# Patient Record
Sex: Female | Born: 1995 | Race: White | Hispanic: No | Marital: Married | Smoking: Never smoker
Health system: Western US, Academic
[De-identification: ages and names within clinical notes are randomized; demographics above are authoritative.]

---

## 2019-05-21 ENCOUNTER — Other Ambulatory Visit: Payer: Self-pay

## 2019-05-22 ENCOUNTER — Encounter (INDEPENDENT_AMBULATORY_CARE_PROVIDER_SITE_OTHER): Payer: Self-pay | Admitting: Family Medicine

## 2019-05-22 ENCOUNTER — Telehealth (INDEPENDENT_AMBULATORY_CARE_PROVIDER_SITE_OTHER): Payer: TRICARE Prime—HMO | Admitting: Family Medicine

## 2019-05-22 DIAGNOSIS — Z1389 Encounter for screening for other disorder: Secondary | ICD-10-CM

## 2019-05-22 DIAGNOSIS — Z1339 Encounter for screening examination for other mental health and behavioral disorders: Secondary | ICD-10-CM

## 2019-05-22 DIAGNOSIS — E282 Polycystic ovarian syndrome: Secondary | ICD-10-CM

## 2019-05-22 DIAGNOSIS — Z3009 Encounter for other general counseling and advice on contraception: Secondary | ICD-10-CM

## 2019-05-22 MED ORDER — METFORMIN HCL PO
ORAL | Status: DC
Start: ? — End: 2020-11-14

## 2019-05-22 MED ORDER — DESOGESTREL-ETHINYL ESTRADIOL 0.15-30 MG-MCG OR TABS
1.0000 | ORAL_TABLET | Freq: Every day | ORAL | 3 refills | Status: DC
Start: 2019-05-22 — End: 2020-03-01

## 2019-05-22 NOTE — Progress Notes (Signed)
FAMILY MEDICINE TELEMEDICINE PROGRESS NOTE    CC:    Chief Complaint   Patient presents with   . Contraception       SUBJECTIVE:    Shannon Bryant is a 23 year old female who is being seen for the following issues:    ---------------------(data below generated by Landis Martins, MD)--------------------    Patient Verification & Telemedicine Consent:    I am proceeding with this evaluation at the direct request of the patient.  I have verified this is the correct patient and have obtained verbal consent and written consent from the patient/ surrogate to perform this voluntary telemedicine evaluation (including obtaining history, performing examination and reviewing data provided by the patient).   The patient/ surrogate has the right to refuse this evaluation.  I have explained risks (including potential loss of confidentiality), benefits, alternatives, and the potential need for subsequent face to face care. Patient/ surrogate understands that there is a risk of medical inaccuracies given that our recommendations will be made based on reported data (and we must therefore assume this information is accurate).  Knowing that there is a risk that this information is not reported accurately, and that the telemedicine video, audio, or data feed may be incomplete, the patient agrees to proceed with evaluation and holds [place] harmless knowing these risks. In this evaluation, we will be providing recommendations only.  The ultimate decision to follow, or not follow, these recommendations will be left to the bedside treating/ requesting practitioner.  The patient/ surrogate has been notified that other healthcare professionals (including students, residents and Metallurgist) may be involved in this audio-video evaluation.   All laws concerning confidentiality and patient access to medical records and copies of medical records apply to telemedicine.  The patient/ surrogate has received the [place] Notice of Privacy  Practices.  I have reviewed this above verification and consent paragraph with the patient/ surrogate.  If the patient is not capacitated to understand the above, and no surrogate is available, since this is not an emergency evaluation, the visit will be rescheduled until such time that the patient can consent, or the surrogate is available to consent.    Demographics:   Medical Record #: [id number]   Date: May 22, 2019   Patient Name: Shannon Bryant   DOB: [birth year]  Age: 23 year old  Sex: female  Location: Home address on file      Evaluator(s):   Shannon Bryant was evaluated by me today.    Clinic Location:  [facility]  [address]  [place] [address]    Interval History: just moved from [place] bc husband is in [employer]    #OCP  Rx'd from [place]  Running out  [place] on for about 4 yrs, isibloom for most of that time, approx 3 yrs  No side effects  Has nl period, 3 days of light bleeding  Never smoker  No migraines  No personal or Fhx clots  Last PAP 01/2019, nl, never had abnl  G0  No h/o STI, not interested in screening    #PCOS  Was told she might have PCOS by gynecologist  Metformin 1 yr ago  Sister has severe PCOS  Metformin causes GI upset  Wt gain, always struggled w/ weight, had lost 10# before moving but has gained   Does have acne  No hair growth  Pt not convinced she has PCOS and  struggles to adhere to metformin    #h/o anxiety  Just stopped fluoextine, did it gradually   Feels well       See ROS above in HPI    Reviewed PMH, meds/allergies/SH    There is no problem list on file for this patient.    No past medical history on file.  No outpatient medications prior to visit.     No facility-administered medications prior to visit.        OBJECTIVE:  There were no vitals taken for this visit.    Physical Exam   Constitutional: She is well-developed, well-nourished, and in no distress.   HENT:   Head: Normocephalic and atraumatic.    Eyes: Conjunctivae are normal. No scleral icterus.   Skin:   Some facial acne.  No clear hair growth on face       LABS:  No results found for this or any previous visit.    STUDIES:     ASSESSMENT & PLAN:  Shannon Bryant is a 23 year old female was seen today for:  Fabia was seen today for contraception.    Diagnoses and all orders for this visit:    Family planning: Pt on OCP's for 4 yrs w/out side effects, pt requesting a refill.  No concerns or contraindications.  Refill until pt can establish with PCP.  Pt up to date on PAP and is low risk. Pt declined STI screening.  -     desogestrel-ethinyl estradiol (ISIBLOOM) 0.15-30 MG-MCG tablet; Take 1 tablet by mouth daily.    PCOS: Pt states she was dx'd w/ PCOS by gyn but she's not convinced that she has it.  She'd like to talk about this w/ new PCP in Oct 2020.  Pt on metformin and OCP's but struggles to adhere to metformin.  Plan to evaluate further at appt 07/2019     Screened negative for alcohol use    Screened negative for drug use            Health Maintenance reviewed -  Health Maintenance   Topic Date Due   . Pap Smear  24-Jul-1996   . CHLAMYDIA SCREENING  08-19-96   . HPV Vaccine <= 26 Yrs (1 - 2-dose series) 07/22/2007   . PHQ2 depression screen  07/21/2014   . Tetanus (1 - Tdap) 07/22/2015   . Influenza (1) 07/02/2019   . Polio Vaccine  Aged Out   . Meningococcal MCV4 Vaccine  Aged Out   . Pneumococcal 0-64 yrs  Aged Out       Patient Instructions   It was great meeting you today and welcome to [facility]!  I prescribed isibloom to [facility] with a 3 month supply.  Let us know if you need anything before your appointment with Dr. Danise Mina on 07/06/19.      Patient verbalized understanding of teaching and instructions, in agreement with plan of care.    Return in about 6 weeks (around 07/06/2019) for pt already has establish care appt w/ Dr. Danise Mina.      Landis Martins, MD      Plan discussed with pt including risks/benefits/alternatives  including watchful waiting.  Informed pt of 24/7 on call MD.  ED if acutely worsening after hours.  Pt verbalized understanding.    Medications reviewed with patient and medication list reconciled.  Over the counter medications, herbal therapies and supplements reviewed.  Patient's understanding and response to medications assessed.    Barriers to medications  assessed and addressed.  Risks, benefits, alternatives to medications reviewed.

## 2019-05-22 NOTE — Patient Instructions (Addendum)
It was great meeting you today and welcome to [facility]!  I prescribed [facility]ib[facility]om to [facility] with a 3 month supply.  Let us know if you need anything before your appointment with Dr. Danise Mina on 07/06/19.

## 2019-05-22 NOTE — Interdisciplinary (Signed)
Pre-visit chart review and huddle completed with staff and physician.    Outstanding labs, imaging and consults reviewed and identified.    Health maintanence issues identified and addressed:    Health Maintenance   Topic Date Due    Pap Smear  Dec 27, 1995    CHLAMYDIA SCREENING  1995/10/24    HPV Vaccine <= 26 Yrs (1 - 2-dose series) 07/22/2007    PHQ2 depression screen  07/21/2014    Tetanus (1 - Tdap) 07/22/2015    Influenza (1) 07/02/2019    Polio Vaccine  Aged Out    Meningococcal MCV4 Vaccine  Aged Out    Pneumococcal 0-64 yrs  Aged Out

## 2019-07-06 ENCOUNTER — Encounter (INDEPENDENT_AMBULATORY_CARE_PROVIDER_SITE_OTHER): Payer: Self-pay | Admitting: Family Medicine

## 2019-07-06 ENCOUNTER — Other Ambulatory Visit: Payer: TRICARE Prime—HMO | Attending: Family Medicine

## 2019-07-06 ENCOUNTER — Ambulatory Visit (INDEPENDENT_AMBULATORY_CARE_PROVIDER_SITE_OTHER): Payer: TRICARE Prime—HMO | Admitting: Family Medicine

## 2019-07-06 VITALS — BP 117/80 | HR 114 | Temp 98.6°F | Ht 62.0 in | Wt 215.0 lb

## 2019-07-06 DIAGNOSIS — E282 Polycystic ovarian syndrome: Secondary | ICD-10-CM

## 2019-07-06 DIAGNOSIS — Z0000 Encounter for general adult medical examination without abnormal findings: Secondary | ICD-10-CM | POA: Insufficient documentation

## 2019-07-06 DIAGNOSIS — Z0189 Encounter for other specified special examinations: Secondary | ICD-10-CM

## 2019-07-06 DIAGNOSIS — E669 Obesity, unspecified: Secondary | ICD-10-CM

## 2019-07-06 LAB — CBC WITH DIFF, BLOOD
ANC-Automated: 6.6 10*3/uL (ref 1.6–7.0)
Abs Basophils: 0.1 10*3/uL
Abs Eosinophils: 0.2 10*3/uL (ref 0.0–0.5)
Abs Lymphs: 4.2 10*3/uL — ABNORMAL HIGH (ref 0.8–3.1)
Abs Monos: 0.7 10*3/uL (ref 0.2–0.8)
Basophils: 1 %
Eosinophils: 2 %
Hct: 41.4 % (ref 34.0–45.0)
Hgb: 12.9 gm/dL (ref 11.2–15.7)
Imm Gran %: 1 % (ref <1)
Imm Gran Abs: 0.1 10*3/uL (ref <0.1)
Lymphocytes: 35 %
MCH: 26.9 pg (ref 26.0–32.0)
MCHC: 31.2 g/dL — ABNORMAL LOW (ref 32.0–36.0)
MCV: 86.4 um3 (ref 79.0–95.0)
MPV: 10 fL (ref 9.4–12.4)
Monocytes: 6 %
Plt Count: 443 10*3/uL — ABNORMAL HIGH (ref 140–370)
RBC: 4.79 10*6/uL (ref 3.90–5.20)
RDW: 13.4 % (ref 12.0–14.0)
Segs: 56 %
WBC: 11.8 10*3/uL — ABNORMAL HIGH (ref 4.0–10.0)

## 2019-07-06 LAB — COMPREHENSIVE METABOLIC PANEL, BLOOD
ALT (SGPT): 11 U/L (ref 0–33)
AST (SGOT): 14 U/L (ref 0–32)
Albumin: 4.3 g/dL (ref 3.5–5.2)
Alkaline Phos: 61 U/L (ref 35–140)
Anion Gap: 12 mmol/L (ref 7–15)
BUN: 9 mg/dL (ref 6–20)
Bicarbonate: 25 mmol/L (ref 22–29)
Bilirubin, Tot: 0.3 mg/dL (ref <1.2)
Calcium: 9.5 mg/dL (ref 8.5–10.6)
Chloride: 105 mmol/L (ref 98–107)
Creatinine: 0.99 mg/dL — ABNORMAL HIGH (ref 0.51–0.95)
GFR: >60 mL/min
Glucose: 103 mg/dL — ABNORMAL HIGH (ref 70–99)
Potassium: 4.2 mmol/L (ref 3.5–5.1)
Sodium: 142 mmol/L (ref 136–145)
Total Protein: 7.2 g/dL (ref 6.0–8.0)

## 2019-07-06 LAB — LIPID(CHOL FRACT) PANEL, BLOOD
Cholesterol: 195 mg/dL (ref <200)
HDL-Cholesterol: 40 mg/dL
LDL-Chol (Calc): 118 mg/dL (ref <160)
Non-HDL Cholesterol: 155 mg/dL
Triglycerides: 183 mg/dL — ABNORMAL HIGH (ref 10–170)

## 2019-07-06 LAB — GLYCOSYLATED HGB(A1C), BLOOD: Glyco Hgb (A1C): 5.5 % (ref 4.8–5.8)

## 2019-07-06 NOTE — Interdisciplinary (Signed)
Blood drawn from left arm with 21 gauge needle. 3 tubes taken.   Patient identity authenticated by Michelle Lehoski.

## 2019-07-06 NOTE — Progress Notes (Signed)
SUBJECTIVE:    Shannon Bryant is a 23 year old female here for follow up after establishing care about    Just came to [name] due [place] assignment for her husband.  She has no family in [place]    Main issues addressed today    1.  H/p PCOS.  Diagnosis made by Gyn in [place] as patient has Obesity, had irreg menses and teenager, has mild acne.  She was started of Metformin but self d/c due to GI upset: diarrhea, loose stools and abd pain    She wonders if she should have [place]  Sister with PCOS and classic finding on [place]    Has ahd borderline Glycohb usually high 5.something, always < 6.0    2.  Irreg menses all though teen years but started [facility] at age 68 and since then more regular  Does not plan pregnancy for few more years  On OPC x 3 years doing well on Isibloom    G-0  Last pap 01/2019, normal    3.  Obesity,  Since teen years.  Fam h/o obesity father side.  Acknowledges likely poor eating habits and no regular exercise.  All worsen by moving and stress  No regular exercise    4.  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      Current Outpatient Medications on File Prior to Visit   Medication Sig Dispense Refill   . desogestrel-ethinyl estradiol (ISIBLOOM) 0.15-30 MG-MCG tablet Take 1 tablet by mouth daily. 28 tablet 3   . METFORMIN HCL PO        No current facility-administered medications on file prior to visit.      4.  H/o anxiety was on Prozac but self d/c and feeling OK    OBJECTIVE:    Blood pressure 117/80, pulse 114, temperature 98.6 F (37 C), height 5\' 2"  (1.575 m), weight 97.5 kg (215 lb).    Body mass index is 39.32 kg/m.    Obese F pleasant and cooperative  Mild facial acne.  mostly lower cheeks  Neck: normal thyroid, no masses, no adenopathy  COR: RRR no urmurs       Lungs CTA  Abd: obese, soft, no TTP, no masses  Extrem; trace  edema, normal DP and PT bilaterally      ASSESSMENT/PLAN:    1.  Possible POCS: obesity, irreg menses in past, mild acne.  Will check [place].  Discussed that even if [place] is normal, she could still have PCOS    2.  Obesity   Discussed importance to weight loss.  Discussed risk for DM, and PCOS.  ALso musculoskeletal problems    Strongly recommend calorie count, change in diet, reduce over all calories    Labs requested today    3.  HM patient refused Flu vaccine today    Asked her to release records from [facility]    RTC in 4 weeks

## 2019-07-06 NOTE — Interdisciplinary (Signed)
Pre-visit chart review and huddle completed with staff and physician.    Outstanding labs, imaging and consults reviewed and identified.    Health maintanence issues identified and addressed:    Health Maintenance   Topic Date Due    Pap Smear  03-29-96    Chlamydia Screen  Feb 08, 1996    HPV Vaccine <= 26 Yrs (1 - 2-dose series) 07/22/2007    PHQ2 depression screen  07/21/2014    Tetanus (1 - Tdap) 07/22/2015    Influenza (1) 05/02/2019    Polio Vaccine  Aged Out    Meningococcal MCV4 Vaccine  Aged Out    Pneumococcal 0-64 yrs  Aged Out

## 2019-08-03 ENCOUNTER — Ambulatory Visit
Admission: RE | Admit: 2019-08-03 | Discharge: 2019-08-03 | Disposition: A | Discharge status: Home / Self Care | Payer: TRICARE Prime—HMO | Attending: Diagnostic Radiology | Admitting: Diagnostic Radiology

## 2019-08-03 DIAGNOSIS — D259 Leiomyoma of uterus, unspecified: Secondary | ICD-10-CM | POA: Insufficient documentation

## 2019-08-03 DIAGNOSIS — E282 Polycystic ovarian syndrome: Secondary | ICD-10-CM | POA: Insufficient documentation

## 2019-08-03 DIAGNOSIS — N838 Other noninflammatory disorders of ovary, fallopian tube and broad ligament: Secondary | ICD-10-CM | POA: Insufficient documentation

## 2019-08-04 ENCOUNTER — Encounter (INDEPENDENT_AMBULATORY_CARE_PROVIDER_SITE_OTHER): Payer: Self-pay | Admitting: Hospital

## 2019-08-09 ENCOUNTER — Encounter (INDEPENDENT_AMBULATORY_CARE_PROVIDER_SITE_OTHER): Payer: Self-pay | Admitting: Family Medicine

## 2019-08-10 ENCOUNTER — Encounter (INDEPENDENT_AMBULATORY_CARE_PROVIDER_SITE_OTHER): Payer: Self-pay | Admitting: Family Medicine

## 2019-08-10 NOTE — Telephone Encounter (Signed)
From: Tobe Sos  To: Karsten Fells, MD  Sent: 08/10/2019 10:13 AM PST  Subject: 1-Non Urgent Medical Advice    Dr. Danise Mina,     Thank you for the prompt message about the results from my ultrasound.     Please let me know what my next step should be. If you'd like me to schedule and appointment to come in or a video appointment, I'd be glad to.     Thank you,  Velna Hatchet

## 2019-09-09 ENCOUNTER — Ambulatory Visit (INDEPENDENT_AMBULATORY_CARE_PROVIDER_SITE_OTHER): Payer: Self-pay | Admitting: Family Medicine

## 2019-09-09 ENCOUNTER — Encounter (INDEPENDENT_AMBULATORY_CARE_PROVIDER_SITE_OTHER): Payer: Self-pay | Admitting: Family Medicine

## 2019-09-09 DIAGNOSIS — Z20822 Contact with and (suspected) exposure to covid-19: Secondary | ICD-10-CM

## 2019-09-09 LAB — EMMI, COVID-19: EMMI Video Order Number: 12215436606

## 2019-09-09 NOTE — Telephone Encounter (Signed)
PROVIDER ACTION REQUESTED: NO, FYI Only  Action item needed:  None  Appt scheduled: No    Chief Complaint  Fever, nausea, vomiting, abdominal pain x 3 days  Assessment details:  Abdominal pain is on the RLQ, was mild now moderate and constant.  Abdominal pain is not aggravated with food or activity.  Was nauseous and vomited x 1 on Monday. Vomitus was green.  Had a slight temperature. Not anymore.  Has some lightheadedness and some constipation.  Last bowel movement was yesterday. No blood noted.  Denies trauma, dysuria, blood in her urine, chest pain, sob.  Latest trans-vag ultrasound, PCOS.  Pt was advised to proceed to ER.  She will call her husband to bring her to the ER.    CLINIC/RN/LVN ACTION REQUEST: NO, FYI Only  Action item needed: NO, FYI Only    Routing to Provider for review and Patient advised to be evaluated in ED, and have another person drive them to hospital.  RN placed ED referral in Epic.     2020 FLU VACCINE: NO   COVID TRIAGE PROTOCOL: POSITIVE, see protocol for symptoms       Reason for Call: Abdominal Pain (RLQ)    Disposition: See Provider within 4 hours     COVID-19 Assessment and Triage  Updated 07/10/2019    Please note:  High risk category no longer required for testing.  Please continue to document which category they are in for risk stratification of results.    Use as part of cough, fever, URI or shortness of breath triage:  Do you have a fever OR a new cough or shortness of breath (SOB) no  or  Other symptoms seen in COVID 19 patients include NEW headache, chills with or without repeated shaking, chest tightness, anosmia (loss of smell), ageuisa (loss of taste), sore throat, diarrhea, severe, muscle pain, unexplained fatigue or syncope (fainting), nausea, vomiting, congestion or runny nose. yes      Does patient belong to one of the following categories (not required for testing)?  [] High risk includes age>65, active smoker, chronic lung disease, chronic heart/live/kidney disease,  diabetes, immunosuppression, active cancer   [] Any resident of or provider working in a senior living facility, including skilled nursing facilities or assisted living facilities.  [] Contact to known COVID-19 cases.  [] Health care worker or first responder or frontline worker like delivery or grocery  [] Persons who care for the elderly.  [] Persons experiencing homelessness.  [] Travel in the last 14 days?    [] Close contact with anyone in above above categories?   Pregnant?    No    If NO to symptoms then triage as usual.    IF YES to symptoms evaluate for disposition as per below:    Emergency Department  . Patient has difficulty breathing, sounds very sick or weak to triager, has underlying respiratory or cardiac disease (e.g., asthma, COPD, heart failure)  . If YES, ask patient to be seen in nearest [facility] emergency department  . Ask the patient to request a mask as soon as they enter the building and remind them to wash their hands or use alcohol gel.  . Please alert the location by calling that the patient will be arriving and give them a time estimate for their arrival. Please place ED referral also as per usual.    Urgent Care:   . Patients who have high risk for co-morbidity will need evaluation by a provider.  High risk factors include: age over 71, active smoker,  chronic lung / heart / liver / renal disease, immunosuppressed status, or active cancer.  If YES, refer patient to Urgent Care.  . Patient with reported high fever (100.4 F if measured).  If YES, refer patient Urgent Care.  . Ask the patient to call urgent care staff on arrival, and request a mask as soon as they enter the building and remind them to wash their hands or use alcohol gel.  . Please place [facility] referral also as per usual.  Only need to alert the location by calling that the patient will be arriving and give them a time estimate for their arrival if [facility] is full.       Schedule drive up screening (For Primary care triage nurse  only, for specialty areas, refer patient to primary care provider)  . Indicated for patients with low risk of clinical deterioration and do not have any high risk factors for severe illness.  . Schedule patient to:  [facility], appt = 2281, provider =  [place] [id number]  [facility], appt = 2281, provider = [place] [id number]  [facility], appt = 2281, provider = [place] [id number]  [place], appt = 2281, provider = [place] [id number]  [place], appt = 2281, provider = [place] [id number]  . Pend COVID order panel and route to provider for approval        Reason for Disposition  . [1] Vomiting AND [2] contains bile (green color)    Additional Information  . Negative: Shock suspected (e.g., cold/pale/clammy skin, too weak to stand, low BP, rapid pulse)  . Negative: Difficult to awaken or acting confused (e.g., disoriented, slurred speech)  . Negative: Passed out (i.e., lost consciousness, collapsed and was not responding)  . Negative: Sounds like a life-threatening emergency to the triager  . Negative: Chest pain  . Negative: Pain is mainly in upper abdomen  (if needed ask: "is it mainly above the belly button?")  . Negative: Followed an abdomen (stomach) injury  . Negative: [1] Abdominal pain AND [2] pregnant < 20 weeks  . Negative: [1] Abdominal pain AND [2] pregnant > 20 weeks  . Negative: [1] Abdominal pain AND [2] postpartum (from 0 to 6 weeks after delivery)  . Negative: [1] SEVERE pain (e.g., excruciating) AND [2] present > 1 hour  . Negative: [1] SEVERE pain AND [2] age > 106  . Negative: [1] Vomiting AND [2] contains red blood or black ("coffee ground") material  (Exception: few red streaks in vomit that only happened once)  . Negative: Blood in bowel movements   (Exception: blood on surface of BM with constipation)  . Negative: Black or tarry bowel movements  (Exception: chronic-unchanged  black-grey bowel movements AND  is taking iron pills or Pepto-bismol)  . Negative: Patient sounds very sick or weak to the triager  . Negative: [1] MILD-MODERATE pain AND [2] constant AND [3] present > 2 hours  . Negative: [1] Vomiting AND [2] abdomen looks much more swollen than usual    Answer Assessment - Initial Assessment Questions  1. LOCATION: "Where does it hurt?"       RLQ    2. RADIATION: "Does the pain shoot anywhere else?" (e.g., chest, back)      No    3. ONSET: "When did the pain begin?" (e.g., minutes, hours or days ago)       3 days    4. SUDDEN: "Gradual or sudden  onset?"      Gradual     5. PATTERN "Does the pain come and go, or is it constant?"     - If constant: "Is it getting better, staying the same, or worsening?"       (Note: Constant means the pain never goes away completely; most serious pain is constant and it progresses)      - If intermittent: "How long does it last?" "Do you have pain now?"      (Note: Intermittent means the pain goes away completely between bouts)      Now constant    6. SEVERITY: "How bad is the pain?"  (e.g., Scale 1-10; mild, moderate, or severe)    - MILD (1-3): doesn't interfere with normal activities, abdomen soft and not tender to touch     - MODERATE (4-7): interferes with normal activities or awakens from sleep, tender to touch     - SEVERE (8-10): excruciating pain, doubled over, unable to do any normal activities       Mild to moderate    7. RECURRENT SYMPTOM: "Have you ever had this type of abdominal pain before?" If so, ask: "When was the last time?" and "What happened that time?"       No    8. CAUSE: "What do you think is causing the abdominal pain?"      Not sure    9. RELIEVING/AGGRAVATING FACTORS: "What makes it better or worse?" (e.g., movement, antacids, bowel movement)      None    10. OTHER SYMPTOMS: "Has there been any vomiting, diarrhea, constipation, or urine problems?"        Some constipation    11. PREGNANCY: "Is there any chance you are pregnant?" "When was your last  menstrual period?"        No    Protocols used: ABDOMINAL PAIN - [facility]

## 2019-09-09 NOTE — Telephone Encounter (Signed)
Symptom Call        Next office visit:  None  Did you offer Express Care/Urgent Care:  No    What symptom is the patient experiencing? Patient is calling in stating on 09/07/2019 she was experiencing nausea, hot flashes, chills, and lower right abdominal pain. Patient states that night she vomited once due to the pain. Patient states that yesterdays he felt a lot better only experiencing a dual lower pain. Patient states that today the pain in still experiencing a constant dual cramp and would like to see if she needs to be seen for possible appendicitis or if a cyst ruptured.    Is this a new or ongoing symptom? new  Estimated time since experiencing symptom(s)? 3 days  Is this symptom complaint the result of a fall or injury? No    Name of PCP Provider: Karsten Fells   Insurance Coverage Verified: Active- in network  Last office visit: 07/06/2019    Who is reporting the symptoms? Incoming call from patient    Best way to contact patient: [phone] (mobile)   Alternative communication method: [phone] (mobile)       Encounter created by Care Assist MA.  If further action required please route encounter to appropriate in clinic MA/LVN/Resident pool.

## 2019-10-10 ENCOUNTER — Encounter: Payer: Self-pay | Admitting: Hospital

## 2019-10-16 ENCOUNTER — Encounter: Payer: Self-pay | Admitting: Hospital

## 2019-11-26 ENCOUNTER — Other Ambulatory Visit: Payer: TRICARE Prime—HMO | Attending: Family Medicine

## 2019-11-26 ENCOUNTER — Encounter (INDEPENDENT_AMBULATORY_CARE_PROVIDER_SITE_OTHER): Payer: Self-pay | Admitting: Family Medicine

## 2019-11-26 ENCOUNTER — Ambulatory Visit (INDEPENDENT_AMBULATORY_CARE_PROVIDER_SITE_OTHER): Payer: TRICARE Prime—HMO | Admitting: Family Medicine

## 2019-11-26 VITALS — BP 119/75 | HR 108 | Temp 97.9°F | Ht 62.0 in | Wt 214.0 lb

## 2019-11-26 DIAGNOSIS — F419 Anxiety disorder, unspecified: Secondary | ICD-10-CM

## 2019-11-26 DIAGNOSIS — E282 Polycystic ovarian syndrome: Secondary | ICD-10-CM | POA: Insufficient documentation

## 2019-11-26 DIAGNOSIS — Z0189 Encounter for other specified special examinations: Secondary | ICD-10-CM

## 2019-11-26 DIAGNOSIS — Z139 Encounter for screening, unspecified: Secondary | ICD-10-CM

## 2019-11-26 DIAGNOSIS — Z113 Encounter for screening for infections with a predominantly sexual mode of transmission: Secondary | ICD-10-CM | POA: Insufficient documentation

## 2019-11-26 LAB — CBC WITH DIFF, BLOOD
ANC-Manual Mode: 7.1 10*3/uL — ABNORMAL HIGH (ref 1.6–7.0)
Abs Basophils: 0 10*3/uL
Abs Eosinophils: 0.1 10*3/uL (ref 0.0–0.5)
Abs Lymphs: 5.3 10*3/uL — ABNORMAL HIGH (ref 0.8–3.1)
Abs Monos: 0.4 10*3/uL (ref 0.2–0.8)
Basophils: 0 %
Eosinophils: 1 %
Hct: 41 % (ref 34.0–45.0)
Hgb: 12.7 gm/dL (ref 11.2–15.7)
Lymphocytes: 41 %
MCH: 26.3 pg (ref 26.0–32.0)
MCHC: 31 g/dL — ABNORMAL LOW (ref 32.0–36.0)
MCV: 84.9 um3 (ref 79.0–95.0)
MPV: 10.3 fL (ref 9.4–12.4)
Monocytes: 3 %
Plt Count: 455 10*3/uL — ABNORMAL HIGH (ref 140–370)
RBC: 4.83 10*6/uL (ref 3.90–5.20)
RDW: 13.6 % (ref 12.0–14.0)
Segs: 54 %
WBC: 12.9 10*3/uL — ABNORMAL HIGH (ref 4.0–10.0)

## 2019-11-26 LAB — BASIC METABOLIC PANEL, BLOOD
Anion Gap: 13 mmol/L (ref 7–15)
BUN: 8 mg/dL (ref 6–20)
Bicarbonate: 25 mmol/L (ref 22–29)
Calcium: 10.4 mg/dL (ref 8.5–10.6)
Chloride: 105 mmol/L (ref 98–107)
Creatinine: 0.86 mg/dL (ref 0.51–0.95)
GFR: >60 mL/min
Glucose: 96 mg/dL (ref 70–99)
Potassium: 4.2 mmol/L (ref 3.5–5.1)
Sodium: 143 mmol/L (ref 136–145)

## 2019-11-26 LAB — MDIFF
Bands: 1 % (ref 0–15)
Number of Cells Counted: 115
Plt Est: INCREASED

## 2019-11-26 LAB — TSH, BLOOD: TSH: 2.46 u[IU]/mL (ref 0.27–4.20)

## 2019-11-26 MED ORDER — FLUOXETINE HCL 20 MG OR CAPS
20.0000 mg | ORAL_CAPSULE | Freq: Every day | ORAL | 1 refills | Status: DC
Start: 2019-11-26 — End: 2020-01-22

## 2019-11-26 NOTE — Progress Notes (Signed)
SUBJECTIVE:    Shannon Bryant is a 24 year old female who is here for evaluation of possible anxiety and did difficulty focusing.  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    She moved to [facility] last May to be with her husband who is in the [employer].  They are both here for few more years.  Initially she had a hard time are being away from her family in [place], but now she has adjusted much better to [facility].  She feels that she is happy in [place], she had made some good friends through her church, her husband is very supportive and she connects with her family almost weekly via social media    GAD-7 score was 5 and peak [facility] 9 was 6. She denies symptoms of depression, she feels that are she is adjusting to [facility], she likes to [place] and again has made new friends and has a very supportive husband    She mostly describes feeling a bit overwhelm trying to study, getting shorts done around the house.  She is worrying or over worrying about things, having more difficulty relaxing.  She denies panic attacks.  She thinks that this is affecting her ability to study and be more focus in she is also wonder about ADD.  She has never had any problems through elementary school middle school or high school or even college.  She is able to read a full novel, complete projects and arm is studied in the past without any problems so it is unlikely that this is related to ADD    Patient reports history of anxiety and mild depression arm probably 2 years ago for which she took Prozac for about 6 months this was beneficial to her at the time this was before her moved to [facility] and she was feeling more sad and upset about things.  She thinks that arm this is a bit different than what she is feeling today which is more anxiety and no depression    She did very well on Prozac, had no adverse side effects took for about 6 months    There is no problem list on  file for this patient.      Current Outpatient Medications on File Prior to Visit   Medication Sig Dispense Refill   . desogestrel-ethinyl estradiol (ISIBLOOM) 0.15-30 MG-MCG tablet Take 1 tablet by mouth daily. 28 tablet 3   . METFORMIN HCL PO        No current facility-administered medications on file prior to visit.        OBJECTIVE:    Blood pressure 119/75, pulse 108, temperature 97.9 F (36.6 C), height 5\' 2"  (1.575 m), weight 97.1 kg (214 lb).    She looks well no acute distress.  Smiles.  Speak fluently normal logic and thought process.  Able to express her feelings and concerns    Denies all of the following:  S high, HI, mania, hallucinations, crying spells, sadness, periods of hopelessness    Acknowledge feelings of anxiety, over worrying about things, difficulty relaxing.  Denies alcohol use, recreational drugs, smoking and beta no cannabis use      ASSESSMENT/PLAN:    1.  Symptoms are more consistent with anxiety disorder.  Discussed at length are elements of depression versus ADD.  At this times does not seem that she has depression.  And her history is not  consistent with ADD.  We discussed at length restarting the Prozac since she did very well on it.     Will do labs today including a TSH repeated quite blood count that was elevated last November and check her blood sugars due to PCOS    Plan to visit in 3 weeks via video.  If she has any concerns issues or problem with Prozac she will let me know as immediately.    2.  Maintenance issues.  Will do chlamydia screen today with 3 urine patient declined HPV and influenza vaccines    Return 3 weeks via video

## 2019-11-26 NOTE — Interdisciplinary (Signed)
Pre-visit chart review and huddle completed with staff and physician.    Outstanding labs, imaging and consults reviewed and identified.    Health maintanence issues identified and addressed:    Health Maintenance   Topic Date Due    Chlamydia Screen  Never done    HPV Vaccine <= 26 Yrs (1 - 2-dose series) Never done    Influenza (1) Never done    PHQ2 depression screen  07/05/2020    Pap Smear  02/27/2022    Tetanus (2 - Td) 06/05/2025    Polio Vaccine  Aged Out    Meningococcal MCV4 Vaccine  Aged Out    Pneumococcal 0-64 yrs  Aged Out

## 2019-11-26 NOTE — Interdisciplinary (Signed)
Blood drawn from left arm with 21 gauge needle. 2 tubes taken.   Patient identity authenticated by Melody Gale.

## 2019-11-27 LAB — CHLAMYDIA/GONORRHEA PCR, URINE
Chlamydia trachomatis PCR, Urine: NOT DETECTED
Neisseria gonorrhoeae PCR, Urine: NOT DETECTED

## 2019-12-02 ENCOUNTER — Telehealth (INDEPENDENT_AMBULATORY_CARE_PROVIDER_SITE_OTHER): Payer: Self-pay | Admitting: Family Medicine

## 2019-12-02 ENCOUNTER — Encounter (INDEPENDENT_AMBULATORY_CARE_PROVIDER_SITE_OTHER): Payer: Self-pay | Admitting: Family Medicine

## 2019-12-02 DIAGNOSIS — D72829 Elevated white blood cell count, unspecified: Secondary | ICD-10-CM

## 2019-12-02 DIAGNOSIS — R300 Dysuria: Secondary | ICD-10-CM

## 2019-12-02 NOTE — Telephone Encounter (Signed)
From: Tobe Sos  To: Karsten Fells, MD  Sent: 12/02/2019 2:57 PM PST  Subject: 20-Other    Attached you will find my last blood test results before moving to [facility]. Please let me know if you need any further information from me. Thank you!

## 2019-12-02 NOTE — Telephone Encounter (Signed)
Telephone call with patient    Discussed her Peristent elevated WBC.  She also tells me that it has been elevated last May, before she moved to [place]    Further questioning.  Had veru mild Covid about Jan 22 and tested positive for Covid on Jan 24 at [facility].  She never had fevers, just malaise and fatigue.  Can not remember cough or uri symptoms.  No GI symptoms.     She quarantine x 2 weeks and she was well  Husband was sicker      Patient denies F/C, NS. Fatigue, weight loss, rashes, joint pain and swelling.  No cough, SOB/DOE, sinus pressure, no GI symptoms and no urinary problems    Discussed getting additional labs, check urine, Chest X-ray

## 2019-12-06 ENCOUNTER — Other Ambulatory Visit (HOSPITAL_BASED_OUTPATIENT_CLINIC_OR_DEPARTMENT_OTHER): Payer: Self-pay | Admitting: Hematology & Oncology

## 2019-12-06 DIAGNOSIS — D72820 Lymphocytosis (symptomatic): Secondary | ICD-10-CM

## 2019-12-06 DIAGNOSIS — D75839 Thrombocytosis, unspecified: Secondary | ICD-10-CM

## 2019-12-06 NOTE — Progress Notes (Signed)
Consult Question:     Without signs and symptoms of infections, two elevated WBC     My clinical question: does she need blood cx, urine cs, sputum?     Consulting Provider: Waldon Reining A      HPI: Shannon Bryant is a 24 year old female with history of anxiety/difficulty focusing, PCOS, with persistent, mild, leucocytosis      There is no report  of fever or symptoms suggestive of an infectious process based on chart review.   Reportedly never smoker, drinks a glass of wine a week. Was started on fluoxetine on 11/25/18 for anxiety disorder. Otherwise, no supplements on her med list .     Current Outpatient Medications   Medication Instructions   . desogestrel-ethinyl estradiol (ISIBLOOM) 0.15-30 MG-MCG tablet 1 tablet, Oral, DAILY   . FLUoxetine (PROZAC) 20 mg, Oral, DAILY   . METFORMIN HCL PO No dose, route, or frequency recorded.         Upon review of physical exam noted in the chart-     No mention of adenopathy or hepatosplenomegaly.    Labs:  Lab Results   Component Value Date    HGB 12.7 11/26/2019    HCT 41.0 11/26/2019    WBC 12.9 (H) 11/26/2019    PLT 455 (H) 11/26/2019    MCV 84.9 11/26/2019     Lab Results   Component Value Date    WBC 12.9 (H) 11/26/2019    WBC 11.8 (H) 07/06/2019       Lab Results   Component Value Date    PLT 455 (H) 11/26/2019    PLT 443 (H) 07/06/2019       Results for Bryant, Shannon SALDIERNA (MRN [id number]) as of 12/06/2019 21:17   Ref. Range 07/06/2019 13:47 11/26/2019 00:00   ANC-Manual Mode Latest Ref Range: 1.6 - 7.0 1000/mm3  7.1 (H)   Abs Lymphs Latest Ref Range: 0.8 - 3.1 1000/mm3 4.2 (H) 5.3 (H)   Abs Monos Latest Ref Range: 0.2 - 0.8 1000/mm3 0.7 0.4   Abs Eosinophils Latest Ref Range: 0.0 - 0.5 1000/mm3 0.2 0.1   Abs Basophils Latest Ref Range: 0.0 1000/mm3 0.1 0.0   Imm Gran Abs Latest Ref Range: <0.1 1000/mm3 0.1    Imm Gran % Latest Ref Range: <1 % 1          I personally reviewed the images ( cellavision/ CALM/ 11/26/19) and my findings are as outlined below  WBC: normal in  morphology and distribution. Normal appearing neutrophils, few granular lymphs noted.  Platelets: normal in number and granulation.   RBC: microcytosis +, normochromnic,  no inclusions, no nucleated forms.         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 Also  has mild thrombocytosis.     The differential for leucocytosis is broad.   Common reasons for elevated wbc (neutrophilia) include primary disorders such as hematologic malignancies, genetic disorders like leukocyte adhesion deficiency, hereditary neutrophilia, or normal variation. Alternatively, secondary causes include medications (steroids, G-CSF, lithium, catecholoamines), stress, infection, inflammation, hematologic or solid malignancies, cigarette smoking, intense exercise or asplenia.    On review of  her labs, she has mild leucocytosis dating back to October 2020. There is mild lymphocytosis and neutrophilia, without left shift , eosinophilia or basophilia. On review of the peripheral blood smear, a few granular lymphs are noted. Overall, possibly representative of a  reactive process.     Recs  If clinically warranted and symptomatic, would consider evaluation for an infection.   Otherwise, would consider evaluating for a clonal process with peripheral blood flowcytometry  ( I  have placed orders and am happy to review the results once patient has had them.)  Prior records with CBC trend would be helpful to gauge chronicity of the process.   Since counts are only  mildly elevated, would consider monitoring  CBC for now, unless if she develops new symptoms, at which point, a more detailed hematologic evaluation could be considered.        Thank you for the eConsult,  Caralee Ates, MD, MD  Signature Derived From Controlled Access Password, December 06, 2019, 9:17 PM    The recommendations provided in this eConsult are based on the clinical data available to me at the time, and are furnished without the  benefit of a comprehensive in-person evaluation of the patient.  Any new clinical issues or changes in patient status since the filing of this eConsult will need to be taken into account when assessing these recommendations.  Please contact me if you have further questions.

## 2019-12-07 ENCOUNTER — Other Ambulatory Visit: Payer: TRICARE Prime—HMO | Attending: Hematology & Oncology

## 2019-12-07 DIAGNOSIS — D72820 Lymphocytosis (symptomatic): Secondary | ICD-10-CM

## 2019-12-07 DIAGNOSIS — R300 Dysuria: Secondary | ICD-10-CM

## 2019-12-07 DIAGNOSIS — D72829 Elevated white blood cell count, unspecified: Secondary | ICD-10-CM | POA: Insufficient documentation

## 2019-12-07 DIAGNOSIS — D473 Essential (hemorrhagic) thrombocythemia: Secondary | ICD-10-CM | POA: Insufficient documentation

## 2019-12-07 DIAGNOSIS — D75839 Thrombocytosis, unspecified: Secondary | ICD-10-CM

## 2019-12-07 LAB — CBC WITH DIFF, BLOOD
ANC-Automated: 4.9 10*3/uL (ref 1.6–7.0)
Abs Basophils: 0.1 10*3/uL
Abs Eosinophils: 0.2 10*3/uL (ref 0.0–0.5)
Abs Lymphs: 3.7 10*3/uL — ABNORMAL HIGH (ref 0.8–3.1)
Abs Monos: 0.6 10*3/uL (ref 0.2–0.8)
Basophils: 1 %
Eosinophils: 2 %
Hct: 39.5 % (ref 34.0–45.0)
Hgb: 12.8 gm/dL (ref 11.2–15.7)
Imm Gran Abs: 0.1 10*3/uL (ref <0.1)
Lymphocytes: 39 %
MCH: 27 pg (ref 26.0–32.0)
MCHC: 32.4 g/dL (ref 32.0–36.0)
MCV: 83.3 um3 (ref 79.0–95.0)
MPV: 10.5 fL (ref 9.4–12.4)
Monocytes: 6 %
Plt Count: 407 10*3/uL — ABNORMAL HIGH (ref 140–370)
RBC: 4.74 10*6/uL (ref 3.90–5.20)
RDW: 13.7 % (ref 12.0–14.0)
Segs: 52 %
WBC: 9.5 10*3/uL (ref 4.0–10.0)

## 2019-12-07 LAB — C-REACTIVE PROTEIN, BLOOD: CRP: 0.44 mg/dL (ref <0.5)

## 2019-12-07 LAB — IBC - IRON BINDING CAPACITY
Iron Saturation: 9 %
Iron: 38 ug/dL (ref 37–145)
Total IBC: 436 ug/dL (ref 148–506)
UIBC: 398 ug/dL — ABNORMAL HIGH (ref 112–346)

## 2019-12-07 LAB — SED RATE, BLOOD: Sed Rate: 12 mm/hr (ref 0–20)

## 2019-12-07 LAB — FERRITIN, BLOOD: Ferritin: 52 ng/mL (ref 13–150)

## 2019-12-07 NOTE — Interdisciplinary (Addendum)
As per pt - all lab orders for today    Blood drawn from left arm with 21 gauge butterfly needle. 6 tubes taken.   Patient identity authenticated by Kelli Churn.    Blood culture # 1 drawn from Left antecubital 2 tubes.    Sample labeled and sent to the lab.

## 2019-12-07 NOTE — Addendum Note (Signed)
Addended by: Kelli Churn on: 12/07/2019 03:53 PM     Modules accepted: Orders

## 2019-12-08 LAB — LYMPHOMA FLOW CYTOMETRY PANEL

## 2019-12-09 LAB — URINE CULTURE: Urine Culture Result: NO GROWTH

## 2019-12-12 LAB — BLOOD CULTURE: Blood Culture Result: NO GROWTH

## 2019-12-14 ENCOUNTER — Telehealth (INDEPENDENT_AMBULATORY_CARE_PROVIDER_SITE_OTHER): Payer: TRICARE Prime—HMO | Admitting: Family Medicine

## 2019-12-14 ENCOUNTER — Other Ambulatory Visit: Payer: Self-pay

## 2019-12-14 ENCOUNTER — Encounter (INDEPENDENT_AMBULATORY_CARE_PROVIDER_SITE_OTHER): Payer: Self-pay | Admitting: Family Medicine

## 2019-12-14 DIAGNOSIS — D72829 Elevated white blood cell count, unspecified: Secondary | ICD-10-CM

## 2019-12-14 DIAGNOSIS — E611 Iron deficiency: Secondary | ICD-10-CM

## 2019-12-14 MED ORDER — DOCUSATE SODIUM 250 MG OR CAPS
250.00 mg | ORAL_CAPSULE | Freq: Every day | ORAL | 0 refills | Status: AC
Start: 2019-12-14 — End: ?

## 2019-12-14 MED ORDER — FERROUS SULFATE 324 (65 FE) MG PO TBEC
324.00 mg | DELAYED_RELEASE_TABLET | Freq: Two times a day (BID) | ORAL | 0 refills | Status: AC
Start: 2019-12-14 — End: ?

## 2019-12-14 NOTE — Patient Instructions (Addendum)
Your physician has ordered an X-ray Exam. Please proceed directly to the radiology department today or as your provider has instructed. You will be served by the radiology department on a first come, first served basis. If you have any questions call the clinic or one of the radiology front desk numbers listed below.     [facility] Rad[facility] - [phone]   [facility] - [phone] Or [phone]   [facility] - [phone]   [facility] [phone]   [facility] - [phone]      Please start iron suppementation once a day for 1-2 weeks and then increase to twice a day  It can make stools look dar colored and cause constipation    Drink plenty of water and start Colace once a day if constipation    Recheck blood count in 6-8 weeks,  Already ordered.  Call the office bewfore your next appoint and have labs done before next visit    Evert Kohl, MD

## 2019-12-14 NOTE — Interdisciplinary (Signed)
Pre-visit chart review and huddle completed with staff and physician.    Outstanding labs, imaging and consults reviewed and identified.    Health maintanence issues identified and addressed:    Health Maintenance   Topic Date Due    Influenza (1) Never done    HPV Vaccine <= 26 Yrs (1 - 2-dose series) 12/24/2019 (Originally 07/22/2007)    PHQ9 Depression Monitoring doc flowsheet  03/25/2020    Chlamydia Screen  11/25/2020    Pap Smear  02/27/2022    Tetanus (2 - Td) 06/05/2025    Polio Vaccine  Aged Out    Meningococcal MCV4 Vaccine  Aged Out    Pneumococcal 0-64 yrs  Aged Out

## 2019-12-14 NOTE — Progress Notes (Signed)
FAMILY MEDICINE TELEMEDICINE PROGRESS NOTE    CC:  No chief complaint on file.      SUBJECTIVE:    Shannon Bryant is a 24 year old female who is being seen for the following issues:    ---------------------(data below generated by Karsten Fells, MD)--------------------    Patient Verification & Telemedicine Consent:    I am proceeding with this evaluation at the direct request of the patient.  I have verified this is the correct patient and have obtained verbal consent and written consent from the patient/ surrogate to perform this voluntary telemedicine evaluation (including obtaining history, performing examination and reviewing data provided by the patient).   The patient/ surrogate has the right to refuse this evaluation.  I have explained risks (including potential loss of confidentiality), benefits, alternatives, and the potential need for subsequent face to face care. Patient/ surrogate understands that there is a risk of medical inaccuracies given that our recommendations will be made based on reported data (and we must therefore assume this information is accurate).  Knowing that there is a risk that this information is not reported accurately, and that the telemedicine video, audio, or data feed may be incomplete, the patient agrees to proceed with evaluation and holds [place] harmless knowing these risks. In this evaluation, we will be providing recommendations only.  The ultimate decision to follow, or not follow, these recommendations will be left to the bedside treating/ requesting practitioner.  The patient/ surrogate has been notified that other healthcare professionals (including students, residents and Metallurgist) may be involved in this audio-video evaluation.   All laws concerning confidentiality and patient access to medical records and copies of medical records apply to telemedicine.  The patient/ surrogate has received the [place] Notice of Privacy Practices.  I have reviewed this  above verification and consent paragraph with the patient/ surrogate.  If the patient is not capacitated to understand the above, and no surrogate is available, since this is not an emergency evaluation, the visit will be rescheduled until such time that the patient can consent, or the surrogate is available to consent.    Demographics:   Medical Record #: [id number]   Date: December 14, 2019   Patient Name: Shannon Bryant   DOB: 1996-02-16  Age: 24 year old  Sex: female  Location: Home address on file      Evaluator(s):   ASHYIA SCHRAEDER was evaluated by me today.    Clinic Location:  [facility]   [facility]  [address]  [place] [address]    30 minutes of what became a  minute appointment was spent face to face with patient/cargiver in coordinating care and counseling for the below issues.      HPI by Problem:     1) Elevated WBC.   Has had elevated WBC but last labs was normal  Had Blood cultures, ESR, CRP and UA, GC/CHlam all negative  Chest X-ray was ordered but she did not complete yet.  She was nor aware of this, she will do    We discussed response from E-consult [facility] and possible seeing them vs monitoring.  Decided to monitor and check in 6-8 weeks    Overall feeling well: no F/C, no weight loss, no NS, no URI< cough, myalgias, athralgias, no rashes      2)  Borderline low Iron.  We discussed since she was concerned.  Reports healthy diet  We  will start supplementation and recheck in 6-8 weeks      Review of Systems:   As per HPI and: - Constitutional: negative for: fatigue, night sweats, weight loss, weight gain, malaise, anorexia, fever.  CV: negative for:  palpitations, chest pain, orthopnea, lower extremity edema.  Resp: negative for:  cough, sputum, hemoptysis, shortness of breath, pleuritic pain, wheezing.  GI: negative for: vomiting, nausea, abdominal pain, bleeding from rectum, melena, hematochezia, constipation, diarrhea.  GU: negative for:  nocturia, dysuria, frequency, hesitancy, hematuria.  No rashes, no athralgias, mylagias    There is no problem list on file for this patient.      Outpatient Medications Prior to Visit   Medication Sig Dispense Refill   . desogestrel-ethinyl estradiol (ISIBLOOM) 0.15-30 MG-MCG tablet Take 1 tablet by mouth daily. 28 tablet 3   . FLUoxetine (PROZAC) 20 MG capsule Take 1 capsule (20 mg) by mouth daily. 30 capsule 1   . METFORMIN HCL PO        No facility-administered medications prior to visit.            OBJECTIVE:  Physical Exam: General Appearance: healthy, alert, no distress, pleasant affect, cooperative.       LABS:  Results for orders placed or performed in visit on 12/07/19   IBC - Iron Binding Capacity Green Plasma Separator Tube   Result Value Ref Range    UIBC 398 (H) 112 - 346 ug/dL    Total IBC 436 148 - 506 ug/dL    Iron Saturation 9 %    Iron 38 37 - 145 ug/dL   Ferritin, Blood Green Plasma Separator Tube   Result Value Ref Range    Ferritin 52 13 - 150 ng/mL   Lymphoma Flow Cytometry Panel   Result Value Ref Range    Flow PDF Report       TO VIEW PDF OF REPORT CLICK "VIEW IMAGE" LINK BELOW UNDER LINKED DOCUMENTS     Lymphoma Flow Cytometry Panel                            [facility] FLOW CYTOMETRY REPORT                                    Specimen Type: Peripheral Blood            SoftFlow Order #: [id number]               INTERPRETATION:                                                                 Targeted and limited flow cytometric analysis of peripheral blood reveals no    monotypic B-cells. T-cells exhibit normal CD4:CD8 ratio at 2.5:1. NK cells are  not increased. Clinical and morphologic correlation is advised.                    CLINICAL HISTORY:  Lymphocytosis                                                                   FLOW CYTOMETRIC ASSESSMENT:                                                     The sample for the current flow  cytometry analysis was received on 12/07/2019     and stained on 12/08/2019.                                                        Flow cytometric analysis was performed on a minimum of 23536 cells for each     antibody.                                                                        The lysed peripheral blood was stained with antibodies directed against the     following antigens:CD2, CD3, CD4, CD5, CD7, CD8, CD10, CD11c, CD16, CD19,       CD20, CD38, CD45, CD56, CD57, FMC7, sKappa, sLambda.                               FLOW CYTOMETRY RESULTS:                                                         Targeted flow cytometric analysis reveals an admixture of                       1.    6.2% small mature polytypic B-cells with kappa:lambda ratio of 1.4:1,     2.    59% small mature T-cells with CD4:CD8 ratio of 2.5:1, and                 3.    1.4% NK cells.                                                            The remaining events are attributable to other cell types and debris.              This test was developed and its performance characteristics determined by the   [facility] at [address] [place], [place] [address]. It has not been clear ed or          approved by the [place] Food and Drug Administration. FDA does not require this      test to go through premarket FDA review. This test is used for clinical         purposes. It should not be regarded as investigational or for research. This    laboratory is certified under the Clinical Laboratory Improvement Amendments    (CLIA) as qualified to perform high complexity clinical laboratory testing.                 Electronically signed by:                                                       Ashley Murrain, M.D.,Ph.D.,Attending Hematopathologist                           12/08/2019 4:57 PM                                                                Electronic signature derived from a  single controlled access password     CBC w/ Diff Lavender   Result Value Ref Range    WBC 9.5 4.0 - 10.0 1000/mm3    RBC 4.74 3.90 - 5.20 mill/mm3    Hgb 12.8 11.2 - 15.7 gm/dL    Hct 39.5 34.0 - 45.0 %    MCV 83.3 79.0 - 95.0 um3    MCH 27.0 26.0 - 32.0 pgm    MCHC 32.4 32.0 - 36.0 g/dL    RDW 13.7 12.0 - 14.0 %    MPV 10.5 9.4 - 12.4 fL    Plt Count 407 (H) 140 - 370 1000/mm3    Segs 52 %    Lymphocytes 39 %    Monocytes 6 %    Eosinophils 2 %    Basophils 1 %    ANC-Automated 4.9 1.6 - 7.0 1000/mm3    Imm Gran Abs 0.1 <0.1 1000/mm3    Abs Lymphs 3.7 (H) 0.8 - 3.1 1000/mm3    Abs Monos 0.6 0.2 - 0.8 1000/mm3    Abs Eosinophils 0.2 0.0 - 0.5 1000/mm3    Abs Basophils 0.1 0.0 1000/mm3    Diff Type Automated    C-Reactive Protein, Blood Green Plasma Separator Tube   Result Value Ref Range    CRP 0.44 <0.5 mg/dL   Sedimentation Rate (ESR), Blood Lavender   Result Value Ref Range    Sed Rate 12 0 - 20 mm/hr   Blood Culture Blood Culture Set Blood    Specimen: Blood   Result Value Ref Range    Blood Culture Result No Growth    Urine Culture - See Instructions Urine    Specimen: Urine   Result Value Ref Range    Urine Culture Result No Growth        Results  for LYNLEIGH, KOVACK (MRN [id number]) as of 12/14/2019 09:32   Ref. Range 07/06/2019 13:47 11/26/2019 00:00 12/07/2019 15:49   WBC Latest Ref Range: 4.0 - 10.0 1000/mm3 11.8 (H) 12.9 (H) 9.5   RBC Latest Ref Range: 3.90 - 5.20 mill/mm3 4.79 4.83 4.74   Hgb Latest Ref Range: 11.2 - 15.7 gm/dL 12.9 12.7 12.8   Hct Latest Ref Range: 34.0 - 45.0 % 41.4 41.0 39.5   MCV Latest Ref Range: 79.0 - 95.0 um3 86.4 84.9 83.3   MCH Latest Ref Range: 26.0 - 32.0 pgm 26.9 26.3 27.0   MCHC Latest Ref Range: 32.0 - 36.0 g/dL 31.2 (L) 31.0 (L) 32.4   RDW Latest Ref Range: 12.0 - 14.0 % 13.4 13.6 13.7   Plt Count Latest Ref Range: 140 - 370 1000/mm3 443 (H) 455 (H) 407 (H)   MPV Latest Ref Range: 9.4 - 12.4 fL 10.0 10.3 10.5   Plt Est Unknown  Increased    Segs Latest Units: % 56 54 52   Bands  Latest Ref Range: 0 - 15 %  1    Lymphocytes Latest Units: % 35 41 39   Monocytes Latest Units: % 6 3 6    Eosinophils Latest Units: % 2 1 2    Basophils Latest Units: % 1 0 1   ANC-Automated Latest Ref Range: 1.6 - 7.0 1000/mm3 6.6  4.9   ANC-Manual Mode Latest Ref Range: 1.6 - 7.0 1000/mm3  7.1 (H)    Abs Lymphs Latest Ref Range: 0.8 - 3.1 1000/mm3 4.2 (H) 5.3 (H) 3.7 (H)       Health Maintenance   Topic Date Due   . Influenza (1) Never done   . HPV Vaccine <= 26 Yrs (1 - 2-dose series) 12/24/2019 (Originally 07/22/2007)   . PHQ9 Depression Monitoring doc flowsheet  03/25/2020   . Chlamydia Screen  11/25/2020   . Pap Smear  02/27/2022   . Tetanus (2 - Td) 06/05/2025   . Polio Vaccine  Aged Out   . Meningococcal MCV4 Vaccine  Aged Out   . Pneumococcal 0-64 yrs  Aged Out       ASSESSMENT/PLAN:    1.  History ef elevated WBC, repeated last lab was normal.  No signs of infections.  Normal Inflamm markers. UA, blood cultures.  WIll get Chest X-ray.  Monitior for now    2.  Low iron levels, borderline.  We will start supplementation and recheck.  Discussed possible constipation, Colace added it.  Tke with OJ    RTC 6-8 weeks. Labs prior to visit      Karsten Fells, MD      Plan discussed with pt including risks/benefits/alternatives including watchful waiting.  Informed pt of 24/7 on call MD.  ED if acutely worsening after hours.  Pt verbalized understanding.    Medications reviewed with patient and medication list reconciled.  Over the counter medications, herbal therapies and supplements reviewed.  Patient's understanding and response to medications assessed.    Barriers to medications assessed and addressed.  Risks, benefits, alternatives to medications reviewed.

## 2019-12-30 ENCOUNTER — Encounter (INDEPENDENT_AMBULATORY_CARE_PROVIDER_SITE_OTHER): Payer: Self-pay

## 2020-01-13 ENCOUNTER — Encounter (INDEPENDENT_AMBULATORY_CARE_PROVIDER_SITE_OTHER): Payer: Self-pay | Admitting: Internal Medicine

## 2020-01-13 DIAGNOSIS — Z0000 Encounter for general adult medical examination without abnormal findings: Secondary | ICD-10-CM

## 2020-01-21 ENCOUNTER — Other Ambulatory Visit (INDEPENDENT_AMBULATORY_CARE_PROVIDER_SITE_OTHER): Payer: Self-pay | Admitting: Family Medicine

## 2020-01-21 DIAGNOSIS — F419 Anxiety disorder, unspecified: Secondary | ICD-10-CM

## 2020-01-22 MED ORDER — FLUOXETINE HCL 20 MG OR CAPS
ORAL_CAPSULE | ORAL | 0 refills | Status: DC
Start: 2020-01-22 — End: 2020-03-01

## 2020-01-22 NOTE — Telephone Encounter (Signed)
Covering for primary care provider. Temporary Prescription refilled.    Shannon Rodier NP

## 2020-01-22 NOTE — Telephone Encounter (Signed)
Per UPDATED refill clinic [facility]) protocol (effective 09/17/18):      New therapy for Fluoxetine that was started on 11/26/19 is being routed to you because:      New med starts will need PCP approval for continuation of tx   Pt must be on tx for >90 days and appropriate follow up completed to meet updated protocol criteria      Thank you!    [facility]  [facility])  Refill and Prior [place]  Phone:  [phone]  Ext:  [phone]      Please see tech note below for last/next OV and labs   ^^^^^^^^^^^^^^^^^^^^^^^^^^^^^^^^^^^^^^^^^^^^^^^^^^^^^^^^^^^^^^^^^^^^^^^        Serotonin Reuptake Inhibitor Refill Protocol (or Trazodone or Buspar)    Recent Visits in This Encounter Department       Provider Department Visit Type Primary Dx    12/14/2019 Karsten Fells, MD [facility] Leukocytosis, unspecified type    11/26/2019 Karsten Fells, MD [place] Office Visit Anxiety    07/06/2019 Karsten Fells, MD [facility] Visit PCOS (polycystic ovarian syndrome)    05/22/2019 Landis Martins, MD [facility] [facility] Family planning          Next f/u appt due:  Return in about 2 months (around 02/13/2020) for CBC and iron level.  Next scheduled appointment: Visit date not found     No future appointments.        LABS required:  (Q yr SCr (escitalopram, and paxil only))    Lab Results   Component Value Date    NA 143 11/26/2019    K 4.2 11/26/2019    CL 105 11/26/2019    BICARB 25 11/26/2019    BUN 8 11/26/2019    CREAT 0.86 11/26/2019       Lab Results   Component Value Date    GFRNON >60 11/26/2019         Monitoring required:  (Q year BP, HR)   *Trazodone - irregular HR in pts w CVD and/or risk factors assoc w  QTc prolongation*    (BP range: 0000000  [id number])  Blood Pressure   11/26/19 119/75   07/06/19 117/80       (HR range:  55-110)  Pulse Readings from Last 3 Encounters:   11/26/19 108   07/06/19 114

## 2020-03-01 ENCOUNTER — Other Ambulatory Visit (INDEPENDENT_AMBULATORY_CARE_PROVIDER_SITE_OTHER): Payer: Self-pay | Admitting: Family Medicine

## 2020-03-01 DIAGNOSIS — F419 Anxiety disorder, unspecified: Secondary | ICD-10-CM

## 2020-03-01 DIAGNOSIS — Z3009 Encounter for other general counseling and advice on contraception: Secondary | ICD-10-CM

## 2020-03-01 MED ORDER — FLUOXETINE HCL 20 MG OR CAPS
20.0000 mg | ORAL_CAPSULE | Freq: Every day | ORAL | 1 refills | Status: DC
Start: 2020-03-01 — End: 2020-08-30

## 2020-03-01 NOTE — Telephone Encounter (Signed)
Incoming call from patient requesting refill    Established with: Karsten Fells   Last OV with PCP: 12/14/2019   Next OV with PCP: 03/08/2020   Last OV in department: 12/14/2019   Next OV in department: 03/08/2020    Requested Medication(s):  Requested Prescriptions     Pending Prescriptions Disp Refills    desogestrel-ethinyl estradiol (ISIBLOOM) 0.15-30 MG-MCG tablet 28 tablet 3     Sig: Take 1 tablet by mouth daily.     No Known Allergies     Send to:     [facility], [place] - [address]  [place] [address]  Phone: [phone] Fax: [phone]       Last labs:   Lab Results   Component Value Date    CHOL 195 07/06/2019    HDL 40 07/06/2019    LDLCALC 118 07/06/2019    TRIG 183 (H) 07/06/2019    TSH 2.46 11/26/2019    A1C 5.5 07/06/2019      Blood Pressure   11/26/19 119/75   07/06/19 117/80      Health Maintenance Due   Topic Date Due    HPV Vaccine <= 26 Yrs (1 - 2-dose series) Never done    COVID-19 Vaccine (1) Never done        Current Medication(s):   Current Outpatient Medications   Medication Sig Dispense Refill    desogestrel-ethinyl estradiol (ISIBLOOM) 0.15-30 MG-MCG tablet Take 1 tablet by mouth daily. 28 tablet 3    docusate sodium (COLACE) 250 MG capsule Take 1 capsule (250 mg) by mouth daily. 60 capsule 0    ferrous sulfate 324 (65 Fe) MG TBEC Take 1 tablet (324 mg) by mouth 2 times daily. 180 tablet 0    FLUoxetine (PROZAC) 20 MG capsule Take 1 capsule (20 mg) by mouth daily. 90 capsule 1    METFORMIN HCL PO        No current facility-administered medications for this visit.        Encounter created by Care Assist MA.  If further action required please route encounter to appropriate in clinic MA/LVN/Resident pool.

## 2020-03-01 NOTE — Telephone Encounter (Signed)
Incoming call from patient requesting refill  Medication requested:   Requested Prescriptions     Pending Prescriptions Disp Refills    FLUoxetine (PROZAC) 20 MG capsule 30 capsule 0       Last refill: 01/22/2020  Pharmacy:   [facility], [place] - [id number] AT [address]  [address]  [place] [address]  Phone: [phone] Fax: [phone]    Last visit in this department 12/14/2019  Next visit in this department Visit date not found    Last date of urine immunoassay drug screen       Last date of CURES report                        Prescription refill pended and routed to PCP for review/approval if appropriate.

## 2020-03-02 NOTE — Telephone Encounter (Signed)
Shannon Bryant, CPhT  (Rx Refill and [facility])      Oral Contraceptive Refill Protocol    Recent Visits in This Encounter Department       Provider Department Visit Type Primary Dx    12/14/2019 Karsten Fells, MD [place] [facility] Leukocytosis, unspecified type    11/26/2019 Karsten Fells, MD [place] Office Visit Anxiety    07/06/2019 Karsten Fells, MD [place] [facility] [place] [facility] Visit PCOS (polycystic ovarian syndrome)    05/22/2019 Landis Martins, MD [place] [facility] Family planning         Next f/u appt due:  RTC 6-8 weeks. Labs prior to visit  Next scheduled appointment: 03/08/2020     Future Appointments   Date Time Provider [place]   03/04/2020  1:45 PM [place] [facility]   03/08/2020  1:30 PM Karsten Fells, MD [facility]       Per OV notes from 07/06/2019:  S: H/p PCOS.  Diagnosis made by Gyn in [place] as patient has Obesity, had irreg menses and teenager, has mild acne.  She was started of Metformin but self d/c due to GI upset: diarrhea, loose stools and abd pain  She wonders if she should have [place]  Sister with PCOS and classic finding on [place]  Has ahd borderline Glycohb usually high 5.something, always < 6.0  Irreg menses all though teen years but started [facility] at age 44 and since then more regular  Does not plan pregnancy for few more years  On OPC x 3 years doing well on Isibloom  G-0  Last pap 01/2019, normal  A/P: Possible POCS: obesity, irreg menses in past, mild acne. Will check [place]. Discussed that even if [place] is normal, she could still have PCOS       **No recent notes reviewed per Pharmacy Dept protocol**      LABS required:  (Q year Lipid Panel - if hx HLD ONLY)    Lab Results   Component Value Date    CHOL 195 07/06/2019    TRIG 183 (H) 07/06/2019    HDL 40 07/06/2019    NHDLV 155 07/06/2019    [place] 118 07/06/2019            Monitoring required:   (Q year BP)    (Q year pap smear - varies by age, see below)    (BP range: 0000000  [id number])  Blood Pressure   11/26/19 119/75   07/06/19 117/80       General pap smear guidelines (Note: Use HM tab, unless not available, then follow GLs below):  For patients who have not had a total abdominal hysterectomy (TAH):  Age <21 -- (not required)  Age   44-30 -- q 3 yrs   Age   33-65 -- q 3 yrs unless HPV co-testing (-) then q 5 yrs   Age >29 --  (not required)      Last pap smear: 02/28/2019   Result:  Completed outside-Add results to pt record  Next due per HM:  02/27/2022

## 2020-03-03 MED ORDER — DESOGESTREL-ETHINYL ESTRADIOL 0.15-30 MG-MCG OR TABS
1.0000 | ORAL_TABLET | Freq: Every day | ORAL | 3 refills | Status: DC
Start: 2020-03-03 — End: 2020-11-14

## 2020-03-04 ENCOUNTER — Other Ambulatory Visit (INDEPENDENT_AMBULATORY_CARE_PROVIDER_SITE_OTHER): Payer: TRICARE Prime—HMO | Attending: Family Medicine

## 2020-03-04 ENCOUNTER — Other Ambulatory Visit (INDEPENDENT_AMBULATORY_CARE_PROVIDER_SITE_OTHER): Payer: TRICARE Prime—HMO

## 2020-03-04 DIAGNOSIS — E611 Iron deficiency: Secondary | ICD-10-CM | POA: Insufficient documentation

## 2020-03-04 DIAGNOSIS — D72829 Elevated white blood cell count, unspecified: Secondary | ICD-10-CM | POA: Insufficient documentation

## 2020-03-04 DIAGNOSIS — Z8616 Personal history of COVID-19: Secondary | ICD-10-CM

## 2020-03-04 LAB — CBC WITH DIFF, BLOOD
ANC-Automated: 3.7 10*3/uL (ref 1.6–7.0)
Abs Basophils: 0.1 10*3/uL
Abs Eosinophils: 0.2 10*3/uL (ref 0.0–0.5)
Abs Lymphs: 3 10*3/uL (ref 0.8–3.1)
Abs Monos: 0.5 10*3/uL (ref 0.2–0.8)
Basophils: 1 %
Eosinophils: 2 %
Hct: 42.2 % (ref 34.0–45.0)
Hgb: 13.2 gm/dL (ref 11.2–15.7)
Lymphocytes: 40 %
MCH: 26.9 pg (ref 26.0–32.0)
MCHC: 31.3 g/dL — ABNORMAL LOW (ref 32.0–36.0)
MCV: 86.1 um3 (ref 79.0–95.0)
MPV: 10.7 fL (ref 9.4–12.4)
Monocytes: 6 %
Plt Count: 407 10*3/uL — ABNORMAL HIGH (ref 140–370)
RBC: 4.9 10*6/uL (ref 3.90–5.20)
RDW: 13.3 % (ref 12.0–14.0)
Segs: 50 %
WBC: 7.4 10*3/uL (ref 4.0–10.0)

## 2020-03-04 LAB — IBC - IRON BINDING CAPACITY
Iron Saturation: 14 %
Iron: 49 ug/dL (ref 37–145)
Total IBC: 343 ug/dL (ref 148–506)
UIBC: 294 ug/dL (ref 112–346)

## 2020-03-04 NOTE — Interdisciplinary (Signed)
Blood drawn from right arm with 21 gauge needle. 2 tubes taken.   Patient identity authenticated by Jovanna Abarca.

## 2020-03-08 ENCOUNTER — Encounter (INDEPENDENT_AMBULATORY_CARE_PROVIDER_SITE_OTHER): Payer: Self-pay | Admitting: Family Medicine

## 2020-03-08 ENCOUNTER — Ambulatory Visit (INDEPENDENT_AMBULATORY_CARE_PROVIDER_SITE_OTHER): Payer: TRICARE Prime—HMO | Admitting: Family Medicine

## 2020-03-08 VITALS — BP 119/77 | HR 102 | Temp 97.8°F | Ht 62.0 in | Wt 205.0 lb

## 2020-03-08 DIAGNOSIS — F419 Anxiety disorder, unspecified: Secondary | ICD-10-CM

## 2020-03-08 DIAGNOSIS — Z862 Personal history of diseases of the blood and blood-forming organs and certain disorders involving the immune mechanism: Secondary | ICD-10-CM

## 2020-03-08 DIAGNOSIS — Z3200 Encounter for pregnancy test, result unknown: Secondary | ICD-10-CM

## 2020-03-08 DIAGNOSIS — Z3202 Encounter for pregnancy test, result negative: Secondary | ICD-10-CM

## 2020-03-08 LAB — URINE PREGNANCY TEST POCT: HCG, Urine: NEGATIVE

## 2020-03-08 NOTE — Progress Notes (Signed)
LMP:  May 21  unprotective sexual active:  May 29 throuh JUne 02

## 2020-03-08 NOTE — Progress Notes (Cosign Needed)
SUBJECTIVE:    Shannon Bryant is a 24 year old female  Here for follow un few things    1.  History of Leukocytosis unclear etiology.  Infectious work up negative and last two WBC have ben normal.  Review labs today.  She is doing well and has had concerns re infection    2.  ? Possible pregnancy.  Run out of [facility] and had had unprotected sexual activity at time of ovulation.  Requesting pregnancy test.   HCG was negative.  No abnormal bleeding, no pelvic pain.  Explained that this may be too early to detect and to monitor at home in a week or so    3.  Anxiety and mild depression.  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  Covid.  I noted she has not gotten vaccine yet and encouraged her ti do so.  She is reluctant because she wants to start at becoming pregnant and she is uncertain about potentoal adverse effects    I shared that CDC recommend Covid for pregnant patients and sp far we have not seen edvrse side effectes.  Also acknowledged her concerns and ask her to consider risk/benefits      There is no problem list on file for this patient.      Current Outpatient Medications on File Prior to Visit   Medication Sig Dispense Refill    desogestrel-ethinyl estradiol (ISIBLOOM) 0.15-30 MG-MCG tablet Take 1 tablet by mouth daily. 84 tablet 3    docusate sodium (COLACE) 250 MG capsule Take 1 capsule (250 mg) by mouth daily. 60 capsule 0    ferrous sulfate 324 (65 Fe) MG TBEC Take 1 tablet (324 mg) by mouth 2 times daily. 180 tablet 0    FLUoxetine (PROZAC) 20 MG capsule Take 1 capsule (20 mg) by mouth daily. 90 capsule 1    METFORMIN HCL PO        No current facility-administered medications on file prior to visit.       OBJECTIVE:    Blood pressure 119/77, pulse 102, temperature 97.8 F (36.6 C), height 5\' 2"  (1.575 m), weight 93 kg (205 lb).    She looks well no acute distress.  Smiles.  Speak fluently normal logic and thought process.       COR:  RRR no murmurs    Lungs:   CTA  Results for orders placed or performed in visit on 03/08/20   Urine Pregnancy Test (POCT)   Result Value Ref Range    HCG, Urine neg     Preg Test, Control ver      Results for Shannon Bryant, Shannon Bryant (MRN [id number]) as of 03/08/2020 18:43   Ref. Range 07/06/2019 13:47 11/26/2019 00:00 12/07/2019 15:49 03/04/2020 15:04   WBC Latest Ref Range: 4.0 - 10.0 1000/mm3 11.8 (H) 12.9 (H) 9.5 7.4   RBC Latest Ref Range: 3.90 - 5.20 mill/mm3 4.79 4.83 4.74 4.90   Hgb Latest Ref Range: 11.2 - 15.7 gm/dL 12.9 12.7 12.8 13.2   Hct Latest Ref Range: 34.0 - 45.0 % 41.4 41.0 39.5 42.2   MCV Latest Ref Range: 79.0 - 95.0 um3 86.4 84.9 83.3 86.1   MCH Latest Ref Range: 26.0 - 32.0 pgm 26.9 26.3 27.0 26.9   MCHC Latest Ref Range: 32.0 - 36.0 g/dL 31.2 (L) 31.0 (L) 32.4 31.3 (L)   RDW Latest Ref Range: 12.0 - 14.0 %  13.4 13.6 13.7 13.3   Plt Count Latest Ref Range: 140 - 370 1000/mm3 443 (H) 455 (H) 407 (H) 407 (H)         ASSESSMENT/PLAN:    1. Leukocytosis resolved    2.  Negative HCG.  Asked her to monitor next week, and to wait for her next menses before starting OPC again    3.  Anxiety/Depression  Doing well on Prozac    4.  Encouraged her to get Covid vaccine

## 2020-03-08 NOTE — Patient Instructions (Signed)
Please check pregnancy test net week

## 2020-03-08 NOTE — Interdisciplinary (Signed)
Pre-visit chart review and huddle completed with staff and physician.    Outstanding labs, imaging and consults reviewed and identified.    Health maintanence issues identified and addressed:    Health Maintenance   Topic Date Due    HPV Vaccine <= 26 Yrs (1 - 2-dose series) Never done    COVID-19 Vaccine (1) Never done    Influenza (Season Ended) 05/01/2020    Chlamydia Screen  11/25/2020    PHQ2 depression screen  12/13/2020    Pap Smear  02/27/2022    Tetanus (2 - Td or Tdap) 06/05/2025    Polio Vaccine  Aged Out    Meningococcal MCV4 Vaccine  Aged Out    Pneumococcal Vaccine  Aged Out

## 2020-04-01 ENCOUNTER — Other Ambulatory Visit: Payer: Self-pay

## 2020-04-01 ENCOUNTER — Telehealth (INDEPENDENT_AMBULATORY_CARE_PROVIDER_SITE_OTHER): Payer: TRICARE Prime—HMO | Admitting: Nurse Practitioner

## 2020-04-01 DIAGNOSIS — M25562 Pain in left knee: Secondary | ICD-10-CM

## 2020-04-01 DIAGNOSIS — M25462 Effusion, left knee: Secondary | ICD-10-CM

## 2020-04-01 NOTE — Progress Notes (Signed)
Chief Complaint   Patient presents with    Musculoskeletal Problem     FAMILY MEDICINE TELEMEDICINE PROGRESS NOTE    ---------------------(data below generated by Rolley Sims, NP)--------------------    Patient Verification & Telemedicine Consent:    I am proceeding with this evaluation at the direct request of the patient.  I have verified this is the correct patient and have obtained verbal consent and written consent from the patient/ surrogate to perform this voluntary telemedicine evaluation (including obtaining history, performing examination and reviewing data provided by the patient).   The patient/ surrogate has the right to refuse this evaluation.  I have explained risks (including potential loss of confidentiality), benefits, alternatives, and the potential need for subsequent face to face care. Patient/ surrogate understands that there is a risk of medical inaccuracies given that our recommendations will be made based on reported data (and we must therefore assume this information is accurate).  Knowing that there is a risk that this information is not reported accurately, and that the telemedicine video, audio, or data feed may be incomplete, the patient agrees to proceed with evaluation and holds [place] harmless knowing these risks. In this evaluation, we will be providing recommendations only.  The ultimate decision to follow, or not follow, these recommendations will be left to the bedside treating/ requesting practitioner.  The patient/ surrogate has been notified that other healthcare professionals (including students, residents and Metallurgist) may be involved in this audio-video evaluation.   All laws concerning confidentiality and patient access to medical records and copies of medical records apply to telemedicine.  The patient/ surrogate has received the [place] Notice of Privacy Practices.  I have reviewed this above verification and consent paragraph with the patient/ surrogate.  If the  patient is not capacitated to understand the above, and no surrogate is available, since this is not an emergency evaluation, the visit will be rescheduled until such time that the patient can consent, or the surrogate is available to consent.      Demographics:   Medical Record #: [id number]   Date: April 05, 2020   Patient Name: Shannon Bryant   DOB: 01-05-1996  Age: 24 year old  Sex: female  Location: Other: [place]    Evaluator(s):   AIDE WOJNAR was evaluated by me today.    Clinic Location:  [place] [facility]  [address]  [place] [address]      Type of Visit: video visit due to COVID-19    Pre-visit planning reviewing the last office visit, labs, imaging and care everywhere when applicable was 5 minutes  Intra-visit was 15 minutes and included updating the relevant history, performing a video-based physical exam as appropriate, creating a treatment plan and used shared decision making with the patient.   Post-visit was 5 minutes that encompassed note completion, placing of orders, updating patient instructions and coordination of care.    Total duration of encounter spent in the above activities, excluding separately reportable services/procedures: 25 minutes            Subjective:   Shannon Bryant is a(n) 24 year old female with a past medical history noted below who presents for video visit to discuss :     L knee pain  # last Tuesday (approx 1.5 weeks ago) noted pain and swelling to L knee knelt down with flexed knees and shifted forward and felt a "pop". Pain has  gradually worsened, especially since last Friday. Flexion and extension exacerbates symptoms. Able to stand and walk but with discomfort.  + swelling  Denies bruising but "slight discoloration"  Denies locking   Langley Gauss giving out sensation  Denies previous injuries or surgeries to L knee  Hx of R tibia fx R side due to sport injury.            ROS:  Constitutional:  negative, malaise, anorexia, fever.  Musculoskeletal: see HPI.    History:  There is no problem list on file for this patient.    No past medical history on file.  No past surgical history on file.  Social History     Socioeconomic History    Marital status: Married     Spouse name: Not on file    Number of children: Not on file    Years of education: Not on file    Highest education level: Not on file   Occupational History    Not on file   Tobacco Use    Smoking status: Never Smoker    Smokeless tobacco: Never Used   Substance and Sexual Activity    Alcohol use: Yes     Alcohol/week: 1.0 standard drinks     Types: 1 Glasses of wine per week    Drug use: Not on file    Sexual activity: Not on file   Other Topics Concern    Not on file   Social History Narrative    Not on file     Social Determinants of Health     Financial Resource Strain:     Difficulty of Paying Living Expenses:    Food Insecurity:     Worried About Charity fundraiser in the Last Year:     Arboriculturist in the Last Year:    Transportation Needs:     Film/video editor (Medical):     Lack of Transportation (Non-Medical):    Physical Activity:     Days of Exercise per Week:     Minutes of Exercise per Session:    Stress:     Feeling of Stress :    Social Connections:     Frequency of Communication with Friends and Family:     Frequency of Social Gatherings with Friends and Family:     Attends Religious Services:     Active Member of Clubs or Organizations:     Attends Music therapist:     Marital Status:    Intimate Partner Violence:     Fear of Current or Ex-Partner:     Emotionally Abused:     Physically Abused:     Sexually Abused:      Current Outpatient Medications   Medication Sig Dispense Refill    desogestrel-ethinyl estradiol (ISIBLOOM) 0.15-30 MG-MCG tablet Take 1 tablet by mouth daily. 84 tablet 3    docusate sodium (COLACE) 250 MG capsule Take 1 capsule (250 mg) by mouth daily. 60  capsule 0    ferrous sulfate 324 (65 Fe) MG TBEC Take 1 tablet (324 mg) by mouth 2 times daily. 180 tablet 0    FLUoxetine (PROZAC) 20 MG capsule Take 1 capsule (20 mg) by mouth daily. 90 capsule 1    METFORMIN HCL PO        No current facility-administered medications for this visit.     No Known Allergies  No family history on file.      Objective:  There were no vitals taken for this visit.    General Appearance: healthy, alert, no distress, pleasant affect, cooperative.  Lungs: normal respiratory rate and rhythm.  Extremities:  no cyanosis, clubbing noted on video visit. Mild swelling noted to left anterior knee.            Rebeca was seen today for musculoskeletal problem.    Diagnoses and all orders for this visit:    Pain and swelling of knee, left  Acute L knee pain and swelling x 1.5 weeks after kneeling injury. Etiology suspicious for knee sprain but differentials include ligament or meniscus etiology  See HPI for details. Noted mild effusion on video visit. Will obtain baseline x rays to assess for osseous etiology. Advised RICE and refer to sports med for further eval. RTC PRN sooner if worsening symptoms.   -     X-Ray Knee 3 Views - Left; Future  -     Sports Medicine [facility] Medicine)          Plan discussed with patient's parent including risks/benefits/alternatives including watchful waiting.  Informed pt of 24/7 on call MD.  ED if acutely worsening after hours.  Pt verbalized understanding.    Barriers to Learning assessed: none. Patient verbalizes understanding of teaching and instructions.    See follow up     Rolley Sims  FNP-C    Cc: Karsten Fells

## 2020-04-02 ENCOUNTER — Other Ambulatory Visit (INDEPENDENT_AMBULATORY_CARE_PROVIDER_SITE_OTHER): Payer: TRICARE Prime—HMO

## 2020-04-02 DIAGNOSIS — M25462 Effusion, left knee: Secondary | ICD-10-CM

## 2020-04-02 DIAGNOSIS — M25562 Pain in left knee: Secondary | ICD-10-CM

## 2020-04-05 ENCOUNTER — Encounter (INDEPENDENT_AMBULATORY_CARE_PROVIDER_SITE_OTHER): Payer: Self-pay | Admitting: Nurse Practitioner

## 2020-06-02 ENCOUNTER — Telehealth (INDEPENDENT_AMBULATORY_CARE_PROVIDER_SITE_OTHER): Payer: Self-pay | Admitting: Family Medicine

## 2020-06-02 DIAGNOSIS — Z111 Encounter for screening for respiratory tuberculosis: Secondary | ICD-10-CM

## 2020-06-02 NOTE — Telephone Encounter (Signed)
Who is calling: Incoming call from patient  Insurance Coverage Verified: Active- in network  Reason for this call: Pt is requesting PPD skin prick TB test and if the order can be placed right away. Pt is [employer] [place] and was informed she needs an order to have it done.      Action required by office: Please contact caller    Duplicate encounter? No previous documentation found on this issue.     Best way to contact: [phone]  Alternative:     Inquiry has been read verbatim to this caller. Verbalizes satisfaction and confirms the above is accurate: yes      Has been advised this message will be transmitted to office and can expect a response within the next 24-72 hours.

## 2020-06-02 NOTE — Telephone Encounter (Signed)
Spoke to pt and she is requesting a TB quantiferon for work. Please sign order.

## 2020-06-04 ENCOUNTER — Other Ambulatory Visit (INDEPENDENT_AMBULATORY_CARE_PROVIDER_SITE_OTHER): Payer: TRICARE Prime—HMO | Attending: Family Medicine

## 2020-06-04 DIAGNOSIS — Z111 Encounter for screening for respiratory tuberculosis: Secondary | ICD-10-CM

## 2020-06-04 NOTE — Interdisciplinary (Signed)
Blood drawn from left arm with 23 gauge needle. 4 tubes taken.   Patient identity authenticated by Laurence J Orgaya.

## 2020-06-08 LAB — QUANTIFERON-TB, BLOOD
Result: NEGATIVE
TB1 Minus Nil: -0.02 [IU]/mL
TB2 minus Nil: -0.04 [IU]/mL

## 2020-08-28 ENCOUNTER — Other Ambulatory Visit (INDEPENDENT_AMBULATORY_CARE_PROVIDER_SITE_OTHER): Payer: Self-pay | Admitting: Family Medicine

## 2020-08-28 DIAGNOSIS — F419 Anxiety disorder, unspecified: Secondary | ICD-10-CM

## 2020-08-30 MED ORDER — FLUOXETINE HCL 20 MG OR CAPS
20.00 mg | ORAL_CAPSULE | Freq: Every day | ORAL | 2 refills | Status: AC
Start: 2020-08-30 — End: ?

## 2020-08-30 NOTE — Telephone Encounter (Signed)
Shannon Bryant, CPhT  (Rx Refill and [place] [facility])      Serotonin Reuptake Inhibitor Refill Protocol (or Trazodone or Buspar)    Last visit in enc specialty: 04/01/2020     Recent Visits in This Encounter Department     Date Provider Department Visit Type Primary Dx    04/01/2020 Rolley Sims, NP [place] [facility] [place] [facility] Pain and swelling of knee, left    03/08/2020 Karsten Fells, MD [place] Office Visit Encounter for pregnancy test, result unknown    12/14/2019 Karsten Fells, MD [place] [facility] Leukocytosis, unspecified type    11/26/2019 Karsten Fells, MD [place] Office Visit Anxiety    07/06/2019 Karsten Fells, MD [place] [facility] Office Visit PCOS (polycystic ovarian syndrome)       Next f/u appt due:  Return for will contact with x ray results; f/u with sports med.      Next appt in enc specialty: Visit date not found      Future Appointments 08/30/2020 - 08/29/2025    None            LABS required:  (Q yr SCr (paxil only; lexapro removed creat req on 06/27/20))    Lab Results   Component Value Date    NA 143 11/26/2019    K 4.2 11/26/2019    CL 105 11/26/2019    BICARB 25 11/26/2019    BUN 8 11/26/2019    CREAT 0.86 11/26/2019       Lab Results   Component Value Date    GFRNON >60 11/26/2019         Monitoring required:  (Q year BP, HR)   *Trazodone - irregular HR in pts w CVD and/or risk factors assoc w  QTc prolongation*    (BP range: RNHAFBXU=38-333  OVANVBTYO=06-00)  Blood Pressure   03/08/20 119/77   11/26/19 119/75   07/06/19 117/80       (HR range: 55-110)  Pulse Readings from Last 3 Encounters:   03/08/20 102   11/26/19 108   07/06/19 114

## 2020-11-14 ENCOUNTER — Ambulatory Visit (INDEPENDENT_AMBULATORY_CARE_PROVIDER_SITE_OTHER): Payer: TRICARE Prime—HMO | Admitting: Family Medicine

## 2020-11-14 ENCOUNTER — Other Ambulatory Visit: Payer: TRICARE Prime—HMO | Attending: Family Medicine

## 2020-11-14 ENCOUNTER — Encounter (INDEPENDENT_AMBULATORY_CARE_PROVIDER_SITE_OTHER): Payer: Self-pay | Admitting: Family Medicine

## 2020-11-14 VITALS — BP 133/85 | HR 120 | Temp 97.1°F | Ht 62.0 in | Wt 224.0 lb

## 2020-11-14 DIAGNOSIS — Z0189 Encounter for other specified special examinations: Secondary | ICD-10-CM

## 2020-11-14 DIAGNOSIS — N912 Amenorrhea, unspecified: Secondary | ICD-10-CM | POA: Insufficient documentation

## 2020-11-14 DIAGNOSIS — Z131 Encounter for screening for diabetes mellitus: Secondary | ICD-10-CM | POA: Insufficient documentation

## 2020-11-14 DIAGNOSIS — R739 Hyperglycemia, unspecified: Secondary | ICD-10-CM

## 2020-11-14 DIAGNOSIS — Z23 Encounter for immunization: Secondary | ICD-10-CM

## 2020-11-14 DIAGNOSIS — Z113 Encounter for screening for infections with a predominantly sexual mode of transmission: Secondary | ICD-10-CM | POA: Insufficient documentation

## 2020-11-14 DIAGNOSIS — E282 Polycystic ovarian syndrome: Secondary | ICD-10-CM

## 2020-11-14 DIAGNOSIS — G4733 Obstructive sleep apnea (adult) (pediatric): Secondary | ICD-10-CM

## 2020-11-14 DIAGNOSIS — Z3200 Encounter for pregnancy test, result unknown: Secondary | ICD-10-CM

## 2020-11-14 LAB — CBC WITH DIFF, BLOOD
ANC-Automated: 8.1 10*3/uL — ABNORMAL HIGH (ref 1.6–7.0)
Abs Basophils: 0.1 10*3/uL — ABNORMAL HIGH (ref <0.1)
Abs Eosinophils: 0.1 10*3/uL (ref 0.0–0.5)
Abs Lymphs: 3.7 10*3/uL — ABNORMAL HIGH (ref 0.8–3.1)
Abs Monos: 0.5 10*3/uL (ref 0.2–0.8)
Basophils: 1 %
Eosinophils: 1 %
Hct: 40.3 % (ref 34.0–45.0)
Hgb: 12.8 gm/dL (ref 11.2–15.7)
Imm Gran %: 1 % — ABNORMAL HIGH (ref <1)
Imm Gran Abs: 0.1 10*3/uL — ABNORMAL HIGH (ref <0.1)
Lymphocytes: 29 %
MCH: 26.8 pg (ref 26.0–32.0)
MCHC: 31.8 g/dL — ABNORMAL LOW (ref 32.0–36.0)
MCV: 84.3 um3 (ref 79.0–95.0)
MPV: 10.5 fL (ref 9.4–12.4)
Monocytes: 4 %
Plt Count: 440 10*3/uL — ABNORMAL HIGH (ref 140–370)
RBC: 4.78 10*6/uL (ref 3.90–5.20)
RDW: 13.6 % (ref 12.0–14.0)
Segs: 64 %
WBC: 12.6 10*3/uL — ABNORMAL HIGH (ref 4.0–10.0)

## 2020-11-14 LAB — TSH, BLOOD: TSH: 1.66 u[IU]/mL (ref 0.27–4.20)

## 2020-11-14 LAB — URINE PREGNANCY TEST POCT: HCG, Urine: NEGATIVE

## 2020-11-14 LAB — GLYCOSYLATED HGB(A1C), BLOOD: Glyco Hgb (A1C): 5.4 % (ref 4.8–5.8)

## 2020-11-14 NOTE — Progress Notes (Signed)
SUBJECTIVE:    Shannon Bryant is a 25 year old female who presents for eval of few issues    1.  Amenorrhea.  She has h/o PCOS.  Was on [facility] until Sept 1021 and has not had menses since Sept.  She wants to become pregnant and would like to see OB-Gyn  Was in Metformin in past but had GI adverse side effects    2. Wants referral to [facility]  Desires to become pregnant soon    3.  OSA  Wants a referral for Sleep study.  Has OSA and has been using an old machine which does not work well anymore.  Has had OSA since childhood.  Does c/o day time sleepness, somnolence and frequent arousal from sleep gasping for air    4.  Offered vaccine today: Flu, HPV, she declined.  Needs complete Covid vacine      There is no problem list on file for this patient.      Current Outpatient Medications on File Prior to Visit   Medication Sig Dispense Refill    docusate sodium (COLACE) 250 MG capsule Take 1 capsule (250 mg) by mouth daily. 60 capsule 0    ferrous sulfate 324 (65 Fe) MG TBEC Take 1 tablet (324 mg) by mouth 2 times daily. 180 tablet 0    FLUoxetine (PROZAC) 20 MG capsule Take 1 capsule (20 mg) by mouth daily. 90 capsule 2            No current facility-administered medications on file prior to visit.     OBJECTIVE:    Blood pressure 133/85, pulse 120, temperature 97.1 F (36.2 C), height 5\' 2"  (1.575 m), weight 101.6 kg (224 lb).    Obese F.    Oroph:  Small posterior pharynx  Neck: large. No masses. No adenopathy  COR:  RRR no murmurs  Lungs CTA    Offered to do pelvic exam and pap, she declined  Has pap smear about 3 years ago      ASSESSMENT/PLAN:    1.  PCOS:  GlycoHb, TSH, pelvic US    2.  Amenorrhea desires pregnancy.  referred to [facility]    3.  OSA, referral to [place] for evaluation

## 2020-11-14 NOTE — Interdisciplinary (Signed)
Blood drawn from right arm with 21 gauge needle. 3 tubes taken.   Patient identity authenticated by msl

## 2020-11-14 NOTE — Interdisciplinary (Signed)
Pre-visit chart review and huddle completed with staff and physician.    Outstanding labs, imaging and consults reviewed and identified.    Health maintanence issues identified and addressed:    Health Maintenance   Topic Date Due    COVID-19 Vaccine (1) Never done    HPV Vaccine <= 26 Yrs (1 - 2-dose series) Never done    Hepatitis C Screening  Never done    Influenza (1) Never done    Chlamydia Screen  11/25/2020    PHQ2 depression screen  03/08/2021    Pap Smear  02/27/2022    Tetanus (2 - Td or Tdap) 06/05/2025    Polio Vaccine  Aged Out    Meningococcal MCV4 Vaccine  Aged Out    Pneumococcal Vaccine  Aged Out

## 2020-11-15 ENCOUNTER — Telehealth (INDEPENDENT_AMBULATORY_CARE_PROVIDER_SITE_OTHER): Payer: Self-pay | Admitting: Family Medicine

## 2020-11-15 DIAGNOSIS — D72829 Elevated white blood cell count, unspecified: Secondary | ICD-10-CM

## 2020-11-15 LAB — CHLAMYDIA/GONORRHEA PCR, URINE
Chlamydia trachomatis PCR, Urine: NOT DETECTED
Neisseria gonorrhoeae PCR, Urine: NOT DETECTED

## 2020-11-15 NOTE — Telephone Encounter (Signed)
Spoke with patient this evening.  Discussed elevated WBC.    She reports: No F/C, no URI, no cough or sore throt, no UTI or GI symptoms.  Has ahd this in past with negative work up    Asked her to repeat in 4 weeks. Letter sent to her    Shannon Kohl, MD

## 2020-11-24 ENCOUNTER — Encounter (INDEPENDENT_AMBULATORY_CARE_PROVIDER_SITE_OTHER): Payer: Self-pay | Admitting: Hospital

## 2020-12-09 ENCOUNTER — Ambulatory Visit (HOSPITAL_BASED_OUTPATIENT_CLINIC_OR_DEPARTMENT_OTHER): Payer: TRICARE Prime—HMO

## 2020-12-22 ENCOUNTER — Encounter (INDEPENDENT_AMBULATORY_CARE_PROVIDER_SITE_OTHER): Payer: Self-pay | Admitting: Family Medicine

## 2020-12-22 NOTE — Telephone Encounter (Signed)
From: Tobe Sos  To: Karsten Fells, MD  Sent: 12/22/2020 9:13 AM PDT  Subject: Dermatology Question    Good morning Dr. Danise Mina,    I hope you are well. I am reaching out hoping to get your medical advise on some severe breakouts that I have been experiencing since stopping my birth control. They seem hormonal in nature, but have progressively gotten worse. The cystic-like breakout have made my face very sensitive to the touch. I am wondering if you have any suggestions of a face wash or prescription cream that I could try to reduce the acne. Thank you for your time.     I am attaching pictures taken from last night, 3/23 @9 :45pm.

## 2020-12-22 NOTE — Telephone Encounter (Signed)
Please review and advise.

## 2020-12-26 NOTE — Telephone Encounter (Signed)
Noted  

## 2021-01-09 ENCOUNTER — Other Ambulatory Visit (INDEPENDENT_AMBULATORY_CARE_PROVIDER_SITE_OTHER): Payer: TRICARE Prime—HMO | Attending: Family Medicine

## 2021-01-09 ENCOUNTER — Other Ambulatory Visit (INDEPENDENT_AMBULATORY_CARE_PROVIDER_SITE_OTHER): Payer: TRICARE Prime—HMO

## 2021-01-09 DIAGNOSIS — D72829 Elevated white blood cell count, unspecified: Secondary | ICD-10-CM | POA: Insufficient documentation

## 2021-01-09 DIAGNOSIS — D72820 Lymphocytosis (symptomatic): Secondary | ICD-10-CM

## 2021-01-09 DIAGNOSIS — E282 Polycystic ovarian syndrome: Secondary | ICD-10-CM

## 2021-01-09 DIAGNOSIS — N912 Amenorrhea, unspecified: Secondary | ICD-10-CM

## 2021-01-09 DIAGNOSIS — N859 Noninflammatory disorder of uterus, unspecified: Secondary | ICD-10-CM

## 2021-01-09 LAB — CBC WITH DIFF, BLOOD
ANC-Automated: 6.4 10*3/uL (ref 1.6–7.0)
Abs Basophils: 0.1 10*3/uL — ABNORMAL HIGH (ref <0.1)
Abs Eosinophils: 0.3 10*3/uL (ref 0.0–0.5)
Abs Lymphs: 4.7 10*3/uL — ABNORMAL HIGH (ref 0.8–3.1)
Abs Monos: 0.8 10*3/uL (ref 0.2–0.8)
Basophils: 1 %
Eosinophils: 2 %
Hct: 39.9 % (ref 34.0–45.0)
Hgb: 12.8 gm/dL (ref 11.2–15.7)
Imm Gran Abs: 0.1 10*3/uL — ABNORMAL HIGH (ref <0.1)
Lymphocytes: 38 %
MCH: 27.2 pg (ref 26.0–32.0)
MCHC: 32.1 g/dL (ref 32.0–36.0)
MCV: 84.9 um3 (ref 79.0–95.0)
MPV: 10.6 fL (ref 9.4–12.4)
Monocytes: 6 %
Plt Count: 416 10*3/uL — ABNORMAL HIGH (ref 140–370)
RBC: 4.7 10*6/uL (ref 3.90–5.20)
RDW: 13.4 % (ref 12.0–14.0)
Segs: 52 %
WBC: 12.3 10*3/uL — ABNORMAL HIGH (ref 4.0–10.0)

## 2021-01-09 NOTE — Interdisciplinary (Signed)
Blood drawn from right arm with 23 gauge needle. 1 tubes taken.   Patient identity authenticated by Brittany Anne Bowens.

## 2021-01-10 ENCOUNTER — Encounter (INDEPENDENT_AMBULATORY_CARE_PROVIDER_SITE_OTHER): Payer: Self-pay | Admitting: Family Medicine

## 2021-01-10 ENCOUNTER — Telehealth (HOSPITAL_BASED_OUTPATIENT_CLINIC_OR_DEPARTMENT_OTHER): Payer: Self-pay

## 2021-01-10 NOTE — Telephone Encounter (Signed)
From: Shannon Bryant  To: Karsten Fells, MD  Sent: 01/10/2021 11:04 AM PDT  Subject: Ultrasound Results    Hello Dr. Danise Mina,    Thank you for the letter going over the recent results from my blood work and ultrasound. Regarding the amenorrhea, does it look like my uterine lining is building up, but that I am just not shedding it/bleeding? Also, I would like to know if there were visible cysts on my ovaries during this ultrasound, as that was not listed in the description. During my last ultrasound in November, the radiologist did state that there were visible cysts.    Thank you

## 2021-01-10 NOTE — Telephone Encounter (Signed)
-   Internal referral from  Karsten Fells, MD  to Hematology-Oncology     - Records available internally. .                  - Dx or reason for referral: 25 yo with persistent Leukocytosis. work for infection: negative    - Previously seen by hematologist/oncology for this or another blood or cancer disorder? No    - Is there a Heme Onc eConsult on file? No    - Routing referral to Heme-onc triage provider, Dr. Maylene Roes for review and advise on the following:         Physician assignment    Scheduling urgency    HTTC or BMT eligible

## 2021-01-11 NOTE — Telephone Encounter (Signed)
Called patient spoke and informed that we are needing an authorization number from [employer] prior to scheduling understood. Stated she would call provided fax number to Hematology.    Tami Ribas, MD  You 22 hours ago (12:17 PM)         Routine any

## 2021-01-26 ENCOUNTER — Emergency Department (INDEPENDENT_AMBULATORY_CARE_PROVIDER_SITE_OTHER): Payer: TRICARE Prime—HMO | Admitting: Physician Assistant

## 2021-01-26 ENCOUNTER — Encounter (INDEPENDENT_AMBULATORY_CARE_PROVIDER_SITE_OTHER): Payer: Self-pay

## 2021-01-26 VITALS — BP 117/73 | HR 88 | Temp 98.2°F | Resp 18

## 2021-01-26 DIAGNOSIS — J029 Acute pharyngitis, unspecified: Secondary | ICD-10-CM

## 2021-01-26 DIAGNOSIS — J069 Acute upper respiratory infection, unspecified: Secondary | ICD-10-CM

## 2021-01-26 LAB — STREP TEST POCT: Rapid Group A Strep Screen: NEGATIVE

## 2021-01-26 NOTE — Progress Notes (Signed)
Chief Complaint:   Chief Complaint   Patient presents with    Congestion      X 4 days with sore throat.       Case summary is as follows:  25 year old  female who presents for congestion and sore throat over the last 3-4 days.   Pt is not vaccinated for COVID. Took negative PCR and home COVID tests. Denies sick contacts.  Denies fever, chills, myalgias, loss of smell or taste, chest pain, cough, SOB, abd pain, n/v/d, headache, dizziness.   Pt has not taken anything for her symptoms.       History reviewed. No pertinent past medical history.  No past surgical history on file.  shx- married   No family history on file.  Medication Review Moment  Cannot display prior to admission medications because the patient has not been admitted in this contact.       Review of Systems   Constitutional: Negative for fever and chills.  HENT: +congestion, sore throat Negative for ear pain  Eyes: Negative for discharge and redness.  Cardiovascular: Negative for chest pain.  Respiratory: Negative for cough and shortness of breath.  Gastrointestinal: Negative for nausea, vomiting, abdominal pain and diarrhea.  Neurological: Negative for headache, dizziness.  Skin: negative for rash    Physical Examination:    01/26/21  0858   BP: 117/73   Pulse: 88   Resp: 18   Temp: 98.2 F (36.8 C)   SpO2: 97%     Vital Signs noted from Triage Page.  Pt is a well developed well nourished female in no apparent distress. Alert and oriented x3.   HEENT exam shows: normocephalic, no frontal or maxillary sinus tenderness bilaterally  Ears TMs clear bilaterally, EAC clear bilaterally  Eyes PERRL EOMI, conjunctiva clear bilaterally.   Mouth MMM, uvula midline, oropharynx clear, no tonsillar hypertrophy or exudates.   Neck exam Supple. No lymphadenopathy  Chest exam -No retractions. clear to auscultation bilaterally.   Cardiac exam shows regular rate and rhythm. No murmurs  Neuro exam with grossly nonfocal motor/sensory examination. Stable gait.   Skin  exam shows no lesions or rashes.    Impression: 25 yo female with congestion/sore throat.  Likely URI, viral syndrome, COVID. Rapid strep negative. No e/o PTA. Pt well appearing, vital signs reviewed. Pt to follow up with PCP.       ICD-10-CM ICD-9-CM    1. Sore throat  J02.9 462 Strep Test (POCT)   2. Upper respiratory tract infection, unspecified type  J06.9 465.9          Plan  Decongestants. F/u PCP. Strict return precautions.       Return precautions given  Patient to arrange follow up  Patient comfortable with plan    Wynona Dove, PA

## 2021-01-26 NOTE — Patient Instructions (Signed)
As we discussed, I would recommend taking Sudafed or pseudoephedrine and also using a nasal decongestant such as Afrin or oxymetazoline spray.     You may also do sinus rinses or Neti pot.    Follow-up with your primary care provider.

## 2021-03-01 NOTE — Telephone Encounter (Signed)
Called referring physicians office no answer.  Faxed over paper informing of needing the auth to schedule patient.

## 2021-03-09 ENCOUNTER — Encounter (INDEPENDENT_AMBULATORY_CARE_PROVIDER_SITE_OTHER): Payer: Self-pay | Admitting: Hospital

## 2021-04-18 ENCOUNTER — Encounter (INDEPENDENT_AMBULATORY_CARE_PROVIDER_SITE_OTHER): Payer: TRICARE Prime—HMO | Admitting: Gynecology

## 2022-05-02 ENCOUNTER — Other Ambulatory Visit: Payer: Self-pay

## 2022-05-02 NOTE — Telephone Encounter (Signed)
Student Nurse Outreach   Population Health Services Organization: Cervical Cancer Screening     S: Shannon Bryant is a 26 year old contacted regarding overdue cervical cancer screening.     B: Patient identified as overdue for cervical cancer screening via Epic report.         HPV vaccination series noted as complete: no    A: 1st phone call attempt and unable to reach patient. Courtesy voicemail left with outgoing line only indication. Will make 2nd attempt outreach at later time.

## 2022-05-09 NOTE — Telephone Encounter (Signed)
Student Nurse Outreach   Population Health Services Organization: Cervical Cancer Screening     S: Shannon Bryant is a 26 year old contacted regarding overdue cervical cancer screening.     B: Patient identified as overdue for cervical cancer screening via Epic report.         HPV vaccination series noted as complete: no    A: 2nd phone call attempt, unable to reach patient. No 2nd voice message left per protocol.    R: Routing encounter to Professor for review.

## 2022-05-09 NOTE — Telephone Encounter (Signed)
[employer] Nursing Student outreach conducted by nursing student via telephone encounter. Nursing student note reviewed and agreed with student's documentation to be co-signed.      No further action required

## 2022-06-11 ENCOUNTER — Encounter (INDEPENDENT_AMBULATORY_CARE_PROVIDER_SITE_OTHER): Payer: Self-pay

## 2022-06-19 NOTE — Telephone Encounter (Signed)
Letter sent.

## 2022-12-01 IMAGING — MR MRI KNEE RT W/O CONTRAST
1 series · 5 of 5 positions shown · IV contrast (gadolinium)
Comparison: None.

﻿ MRI RIGHT KNEE
INDICATION: Right knee pain.
TECHNIQUE: MRI of the right knee was performed on a low field strength MRI scanner. Multiplanar multisequence imaging was performed including coronal T1, T2, and inversion recovery; sagittal T2 and inversion recovery; and axial T2 sequences without gadolinium.

[Series 9: T2 · coronal · right · 4.0mm · 0.78mm/px · 5 of 5 slices shown]
[im 1/5]
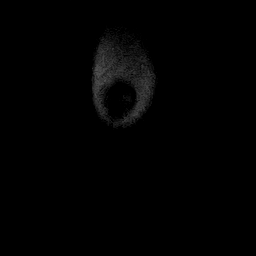
[im 2/5]
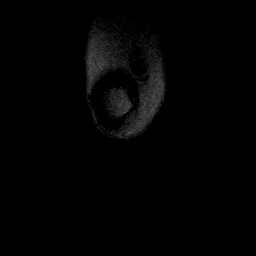
[im 3/5]
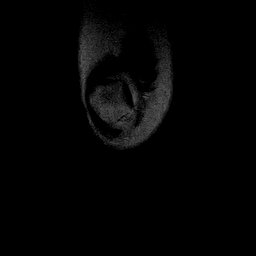
[im 4/5]
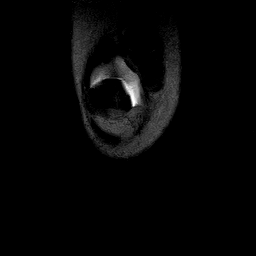
[im 5/5]
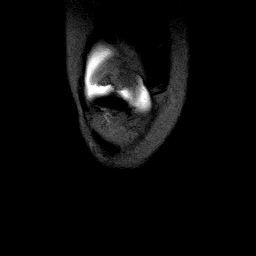

[5 of 5 positions shown; findings below may reference images not displayed]

FINDINGS: Menisci:  No medial meniscal tear is identified. No lateral meniscal tear is identified.

Ligaments: The ACL, PCL, MCL, and LCL complex are intact. 

Tendons: The extensor mechanism and popliteus tendon are intact.

Cartilage/Bone: Generalized bone marrow signal is normal.  There is no fracture or osteonecrosis.

Patellofemoral:  Heterogeneous signal lateral patellar facet, compatible with low-grade patellofemoral chondromalacia.  There is edema superolateral aspect of Hoffa's fat pad, which may indicate impingement and patellofemoral maltracking.  No macroscopic chondral defect.  Additional focal full-thickness chondral fissure medial femoral trochlea with subchondral bone marrow edema

Medial tibiofemoral:  Cartilage is preserved.

Lateral tibiofemoral:  Cartilage is preserved.

Others:  Small joint effusion.  No popliteal cyst.
IMPRESSION: 1. Low-grade patellofemoral chondromalacia with findings that may indicate patellofemoral maltracking. 

2. Medial femoral trochlear grade 4 chondral fissuring with bone marrow edema 

3. No MR evidence of meniscal tear or ligamentous injury.

## 2022-12-01 IMAGING — MR MRI RIGHT KNEE
6 of 7 series · 33 of 40 positions shown · IV contrast (gadolinium)
Comparison: None.

﻿ MRI RIGHT KNEE
INDICATION: Right knee pain.
TECHNIQUE: MRI of the right knee was performed on a low field strength MRI scanner. Multiplanar multisequence imaging was performed including coronal T1, T2, and inversion recovery; sagittal T2 and inversion recovery; and axial T2 sequences without gadolinium.

[Series 1: scano s/t/c · axial · right · 10.0mm · 0.94mm/px · z∈[-10,+120]mm · 3 of 9 slices shown]
[im 1/9]
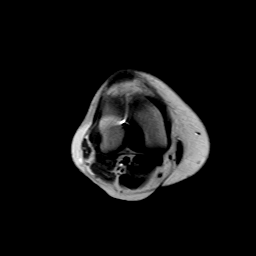
[im 5/9]
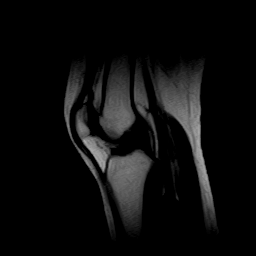
[im 9/9]
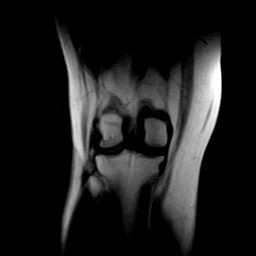

[Series 2: T2 · axial · right · 4.0mm · 0.78mm/px · z∈[-66,+29]mm · 6 of 20 slices shown (1 of 2)]
[im 1/20]
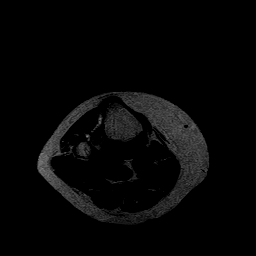
[im 4/20]
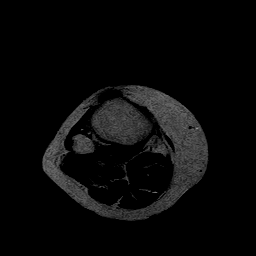
[im 8/20]
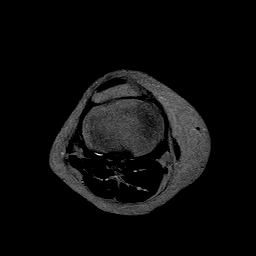
[im 12/20]
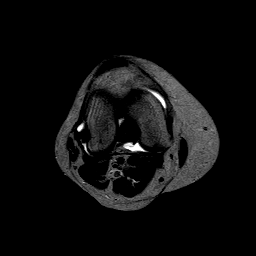
[im 16/20]
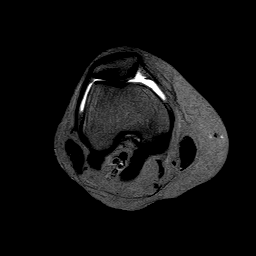
[im 20/20]
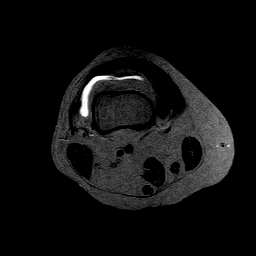

[Series 3: T1 · sagittal · right · 4.0mm · 0.39mm/px · 6 of 20 slices shown (1 of 2)]
[im 1/20]
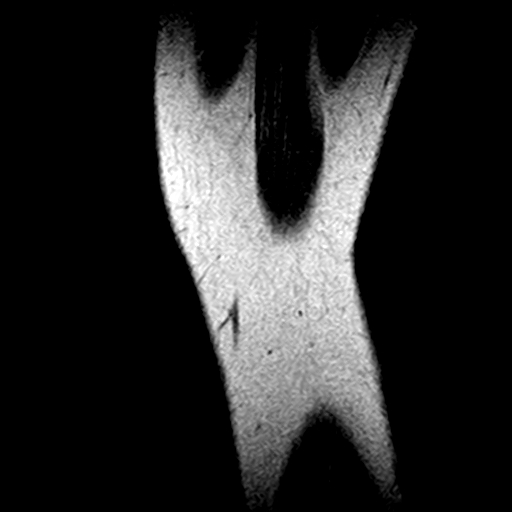
[im 4/20]
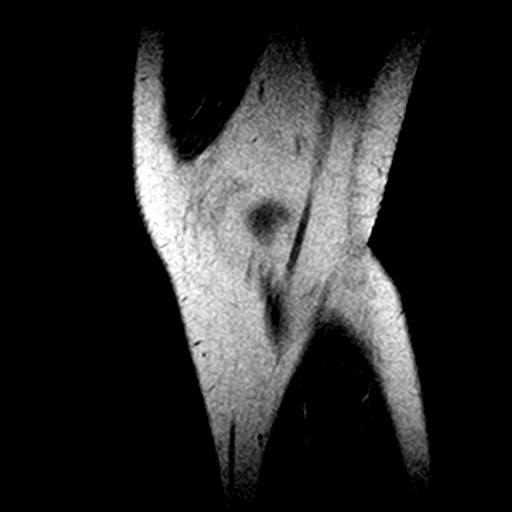
[im 8/20]
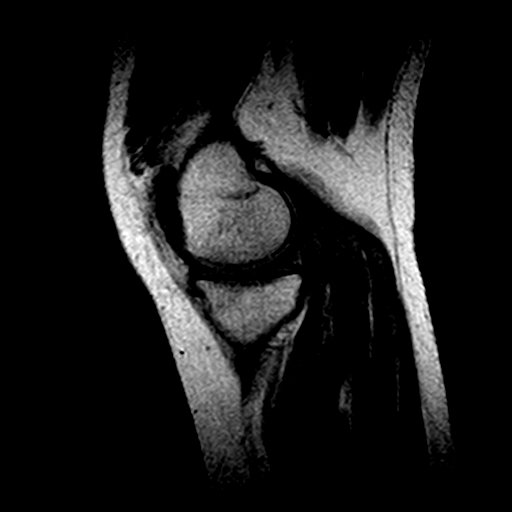
[im 12/20]
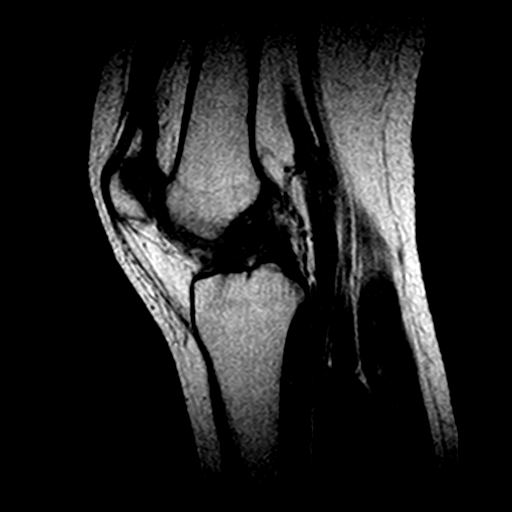
[im 16/20]
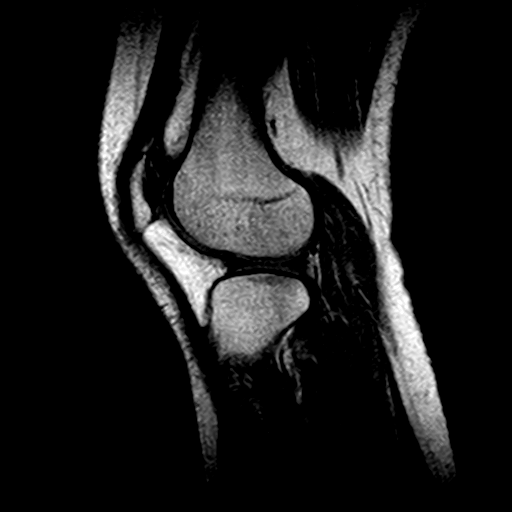
[im 20/20]
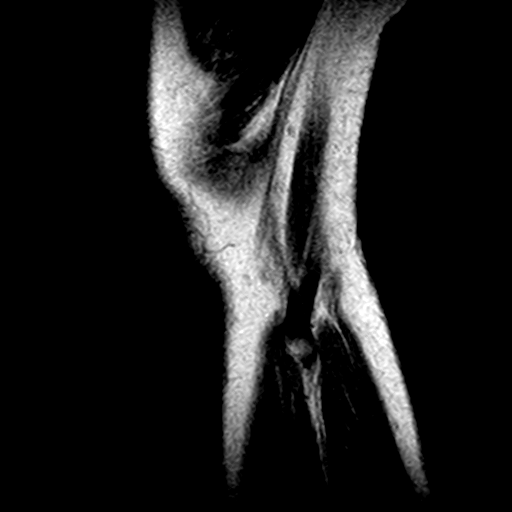

[Series 4: T2 · sagittal · right · 4.0mm · 0.78mm/px · 6 of 20 slices shown (2 of 2)]
[im 1/20]
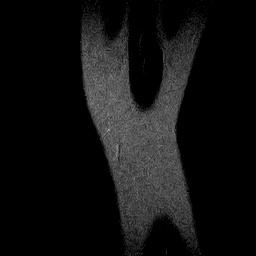
[im 4/20]
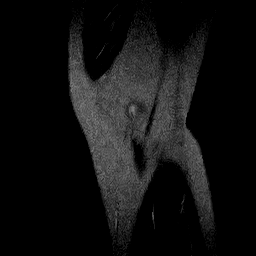
[im 8/20]
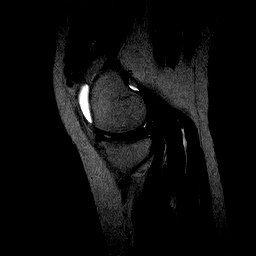
[im 12/20]
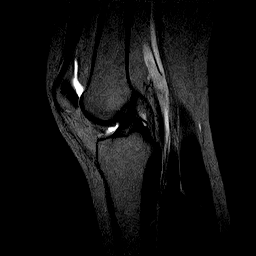
[im 16/20]
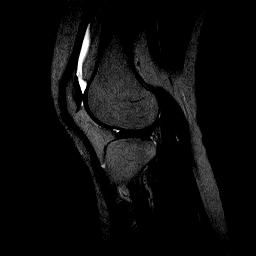
[im 20/20]
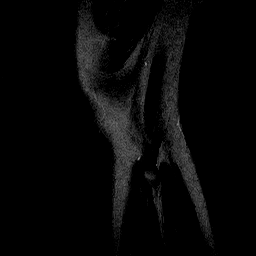

[Series 5: T1 · coronal · right · 4.0mm · 0.39mm/px · 7 of 22 slices shown (2 of 2)]
[im 1/22]
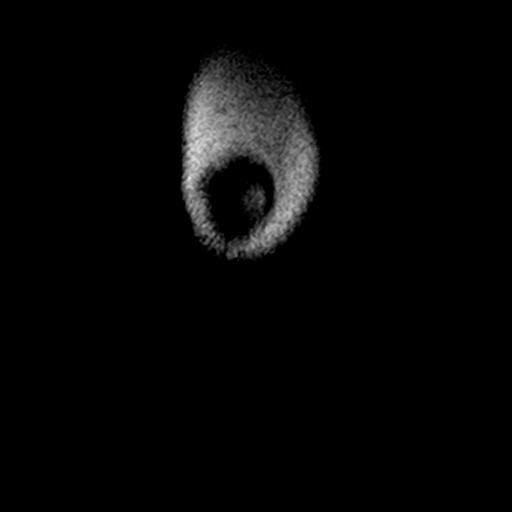
[im 4/22]
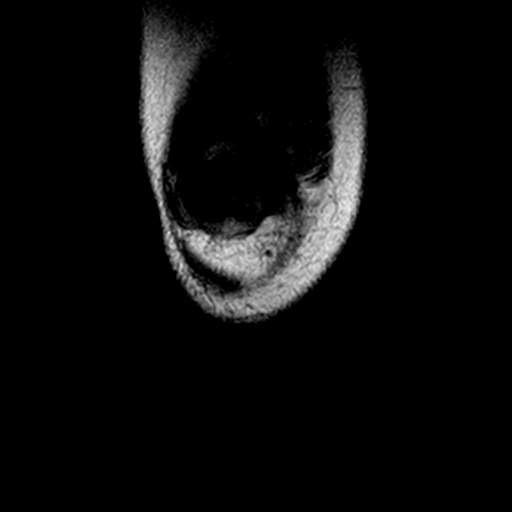
[im 8/22]
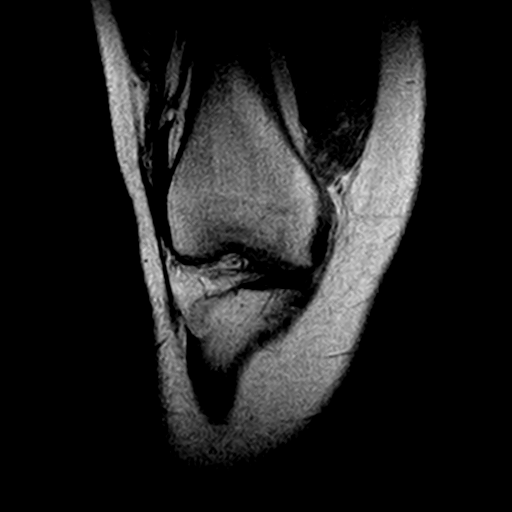
[im 11/22]
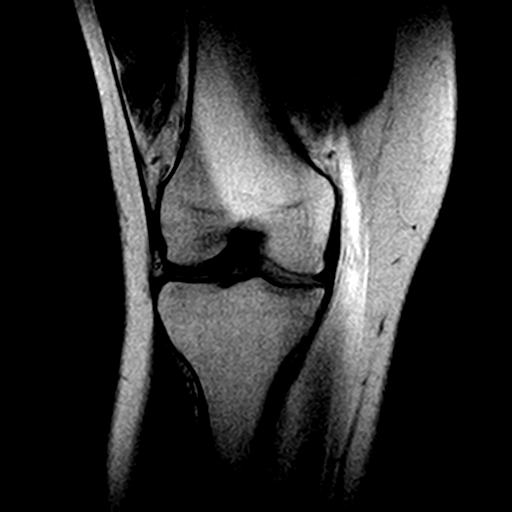
[im 15/22]
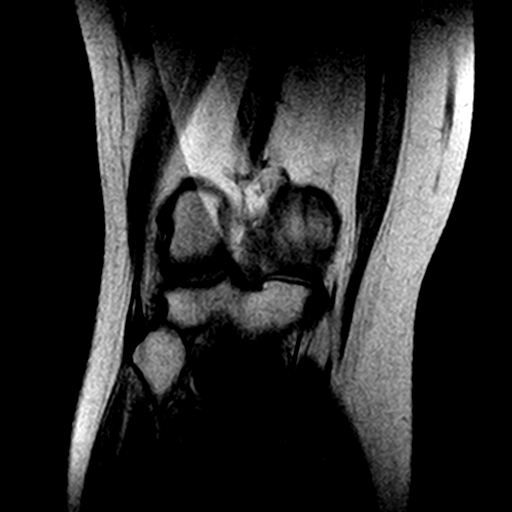
[im 18/22]
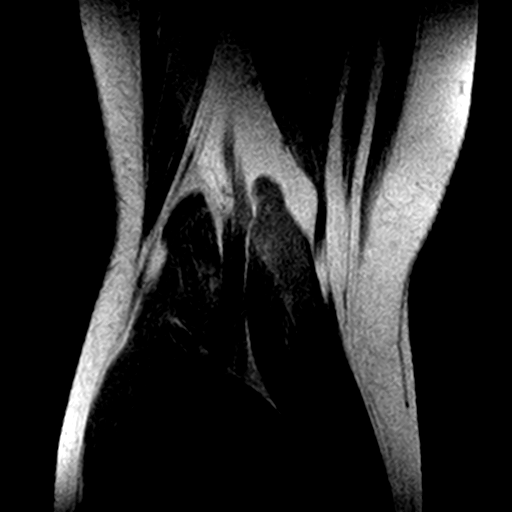
[im 22/22]
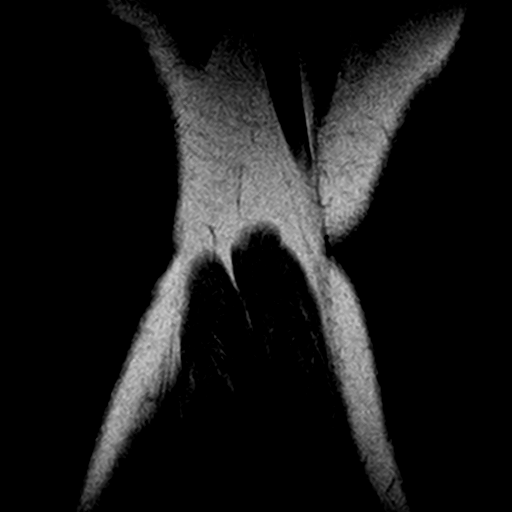

[Series 6: fir cor · coronal · right · 4.0mm · 0.78mm/px · 5 of 21 slices shown]
[im 1/21]
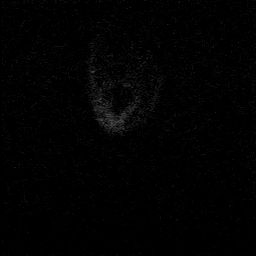
[im 4/21]
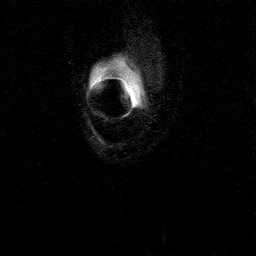
[im 7/21]
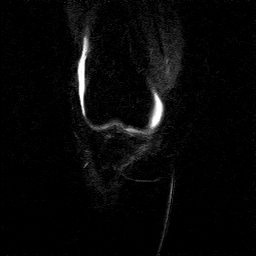
[im 11/21]
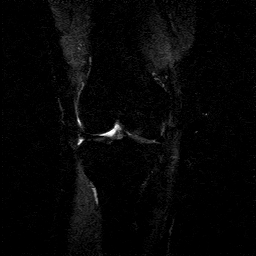
[im 14/21]
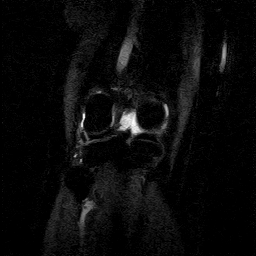

[33 of 40 positions shown; findings below may reference images not displayed]

FINDINGS: Menisci:  No medial meniscal tear is identified. No lateral meniscal tear is identified.

Ligaments: The ACL, PCL, MCL, and LCL complex are intact. 

Tendons: The extensor mechanism and popliteus tendon are intact.

Cartilage/Bone: Generalized bone marrow signal is normal.  There is no fracture or osteonecrosis.

Patellofemoral:  Heterogeneous signal lateral patellar facet, compatible with low-grade patellofemoral chondromalacia.  There is edema superolateral aspect of Hoffa's fat pad, which may indicate impingement and patellofemoral maltracking.  No macroscopic chondral defect.  Additional focal full-thickness chondral fissure medial femoral trochlea with subchondral bone marrow edema

Medial tibiofemoral:  Cartilage is preserved.

Lateral tibiofemoral:  Cartilage is preserved.

Others:  Small joint effusion.  No popliteal cyst.
IMPRESSION: 1. Low-grade patellofemoral chondromalacia with findings that may indicate patellofemoral maltracking. 

2. Medial femoral trochlear grade 4 chondral fissuring with bone marrow edema 

3. No MR evidence of meniscal tear or ligamentous injury.
